# Patient Record
Sex: Male | Born: 1970 | Race: Black or African American | Hispanic: No | Marital: Married | Smoking: Never smoker
Health system: Southern US, Community
[De-identification: ages and names within clinical notes are randomized; demographics above are authoritative.]

## PROBLEM LIST (undated history)

## (undated) DIAGNOSIS — M549 Dorsalgia, unspecified: Secondary | ICD-10-CM

## (undated) HISTORY — DX: Dorsalgia, unspecified: M54.9

---

## 2009-02-07 ENCOUNTER — Encounter: Admission: RE | Admit: 2009-02-07 | Discharge: 2009-02-07 | Payer: Self-pay | Admitting: Occupational Medicine

## 2012-02-11 ENCOUNTER — Ambulatory Visit: Payer: Managed Care, Other (non HMO) | Admitting: Family Medicine

## 2012-02-11 VITALS — BP 150/87 | HR 76 | Temp 98.3°F | Resp 16 | Ht 72.0 in | Wt 206.0 lb

## 2012-02-11 DIAGNOSIS — M545 Low back pain, unspecified: Secondary | ICD-10-CM

## 2012-02-11 DIAGNOSIS — S335XXA Sprain of ligaments of lumbar spine, initial encounter: Secondary | ICD-10-CM

## 2012-02-11 MED ORDER — CYCLOBENZAPRINE HCL 10 MG PO TABS: 10.0000 mg | ORAL_TABLET | Freq: Every evening | ORAL | Status: AC | PRN

## 2012-02-11 MED ORDER — NAPROXEN 500 MG PO TABS: 500.0000 mg | ORAL_TABLET | Freq: Two times a day (BID) | ORAL | Status: AC

## 2012-02-11 MED ORDER — TRAMADOL HCL 50 MG PO TABS: 50.0000 mg | ORAL_TABLET | Freq: Three times a day (TID) | ORAL | Status: AC | PRN

## 2012-02-11 NOTE — Progress Notes (Signed)
  [facility]:  Office Visit  Chief Complaint:  Chief Complaint  Patient presents with  . Back Pain    LBP * 2 -3 days (has had this pain before- see chart) -using tylenol/ibuprofen    HPI: Victor [name] is a 41 y.o. male who complains of low back with bending, this is an acute on chronic problem that started 3 days ago. No problems with standing or sitting. No radicular pain so far. Intermittent tingling down right leg in prior episodes. Hx of traumatic back injury include  Falling on coconut when young and fell on back.  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 Denies loss of bowel and bladder function.  History reviewed. No pertinent past medical history. History reviewed. No pertinent past surgical history. History   Social History  . Marital Status: Married    Spouse Name: N/A    Number of Children: N/A  . Years of Education: N/A   Social History Main Topics  . Smoking status: Never Smoker   . Smokeless tobacco: None  . Alcohol Use: Yes  . Drug Use: No  . Sexually Active: None   Other Topics Concern  . None   Social History Narrative  . None   No family history on file. Allergies  Allergen Reactions  . Penicillins Itching and Rash   Prior to Admission medications   Not on File     ROS: The patient denies fevers, chills, night sweats, unintentional weight loss, chest pain, palpitations, wheezing, dyspnea on exertion, nausea, vomiting, abdominal pain, dysuria, hematuria, melena, numbness, weakness, or tingling. + back pain  All other systems have been reviewed and were otherwise negative with the exception of those mentioned in the HPI and as above.    PHYSICAL EXAM: Filed Vitals:   02/11/12 1028  BP: 150/87  Pulse: 76  Temp: 98.3 F (36.8 C)  Resp: 16   Filed Vitals:   02/11/12 1028  Height: 6' (1.829 m)  Weight: 206 lb (93.441 kg)    Body mass index is 27.94 kg/(m^2).  General: Alert, no acute distress HEENT:  Normocephalic, atraumatic, oropharynx patent.  Cardiovascular:  Regular rate and rhythm, no rubs murmurs or gallops.  No Carotid bruits, radial pulse intact. No pedal edema.  Respiratory: Clear to auscultation bilaterally.  No wheezes, rales, or rhonchi.  No cyanosis, no use of accessory musculature GI: No organomegaly, abdomen is soft and non-tender, positive bowel sounds.  No masses. Skin: No rashes. Neurologic: Facial musculature symmetric. Psychiatric: Patient is appropriate throughout our interaction. Lymphatic: No cervical lymphadenopathy Musculoskeletal: Gait intact. Neck: nl Thoracic: nl Lumbar:  Full AROM, PROM Pain with flexion but is able to touch his toes + paraspinal msk tenderness bilaterally at L2-L5 Sensation intact, 5/5 strength, +2DTRs biceps, knee, ankles bilaterally  LABS: No results found for this or any previous visit.   EKG/XRAY:   Primary read interpreted by Dr. Conley Rolls at [facility].   ASSESSMENT/PLAN: Encounter Diagnoses  Name Primary?  . Low back pain Yes  . Low back strain     D/w patient sxs management and strengthening exercises Rx Flexeril, Naproxen and Tramadol F/u in 1 month/prn   Riyah Bardon PHUONG, DO 02/11/2012 11:27 AM

## 2013-09-02 ENCOUNTER — Ambulatory Visit: Payer: Managed Care, Other (non HMO) | Admitting: Family Medicine

## 2013-09-02 ENCOUNTER — Other Ambulatory Visit: Payer: Self-pay

## 2013-09-02 VITALS — BP 130/76 | HR 87 | Temp 98.6°F | Resp 16 | Ht 72.0 in | Wt 206.0 lb

## 2013-09-02 DIAGNOSIS — M545 Low back pain, unspecified: Secondary | ICD-10-CM

## 2013-09-02 DIAGNOSIS — M47817 Spondylosis without myelopathy or radiculopathy, lumbosacral region: Secondary | ICD-10-CM

## 2013-09-02 DIAGNOSIS — M549 Dorsalgia, unspecified: Secondary | ICD-10-CM

## 2013-09-02 MED ORDER — PREDNISONE 20 MG PO TABS: ORAL_TABLET | ORAL | Status: DC

## 2013-09-02 MED ORDER — METHYLPREDNISOLONE ACETATE 80 MG/ML IJ SUSP
80.0000 mg | Freq: Once | INTRAMUSCULAR | Status: AC
  Administered 2013-09-02: 80 mg via INTRAMUSCULAR

## 2013-09-02 NOTE — Patient Instructions (Signed)
Sciatica Sciatica is pain, weakness, numbness, or tingling along the path of the sciatic nerve. The nerve starts in the lower back and runs down the back of each leg. The nerve controls the muscles in the lower leg and in the back of the knee, while also providing sensation to the back of the thigh, lower leg, and the sole of your foot. Sciatica is a symptom of another medical condition. For instance, nerve damage or certain conditions, such as a herniated disk or bone spur on the spine, pinch or put pressure on the sciatic nerve. This causes the pain, weakness, or other sensations normally associated with sciatica. Generally, sciatica only affects one side of the body. CAUSES   Herniated or slipped disc.  Degenerative disk disease.  A pain disorder involving the narrow muscle in the buttocks (piriformis syndrome).  Pelvic injury or fracture.  Pregnancy.  Tumor (rare). SYMPTOMS  Symptoms can vary from mild to very severe. The symptoms usually travel from the low back to the buttocks and down the back of the leg. Symptoms can include:  Mild tingling or dull aches in the lower back, leg, or hip.  Numbness in the back of the calf or sole of the foot.  Burning sensations in the lower back, leg, or hip.  Sharp pains in the lower back, leg, or hip.  Leg weakness.  Severe back pain inhibiting movement. These symptoms may get worse with coughing, sneezing, laughing, or prolonged sitting or standing. Also, being overweight may worsen symptoms. DIAGNOSIS  Your caregiver will perform a physical exam to look for common symptoms of sciatica. He or she may ask you to do certain movements or activities that would trigger sciatic nerve pain. Other tests may be performed to find the cause of the sciatica. These may include:  Blood tests.  X-rays.  Imaging tests, such as an MRI or CT scan. TREATMENT  Treatment is directed at the cause of the sciatic pain. Sometimes, treatment is not necessary  and the pain and discomfort goes away on its own. If treatment is needed, your caregiver may suggest:  Over-the-counter medicines to relieve pain.  Prescription medicines, such as anti-inflammatory medicine, muscle relaxants, or narcotics.  Applying heat or ice to the painful area.  Steroid injections to lessen pain, irritation, and inflammation around the nerve.  Reducing activity during periods of pain.  Exercising and stretching to strengthen your abdomen and improve flexibility of your spine. Your caregiver may suggest losing weight if the extra weight makes the back pain worse.  Physical therapy.  Surgery to eliminate what is pressing or pinching the nerve, such as a bone spur or part of a herniated disk. HOME CARE INSTRUCTIONS   Only take over-the-counter or prescription medicines for pain or discomfort as directed by your caregiver.  Apply ice to the affected area for 20 minutes, 3 4 times a day for the first 48 72 hours. Then try heat in the same way.  Exercise, stretch, or perform your usual activities if these do not aggravate your pain.  Attend physical therapy sessions as directed by your caregiver.  Keep all follow-up appointments as directed by your caregiver.  Do not wear high heels or shoes that do not provide proper support.  Check your mattress to see if it is too soft. A firm mattress may lessen your pain and discomfort. SEEK IMMEDIATE MEDICAL CARE IF:   You lose control of your bowel or bladder (incontinence).  You have increasing weakness in the lower back,   see if it is too soft. A firm mattress may lessen your pain and discomfort.  SEEK IMMEDIATE MEDICAL CARE IF:    You lose control of your bowel or bladder (incontinence).   You have increasing weakness in the lower back, pelvis, buttocks, or legs.   You have redness or swelling of your back.   You have a burning sensation when you urinate.   You have pain that gets worse when you lie down or awakens you at night.   Your pain is worse than you have experienced in the past.   Your pain is lasting longer than 4 weeks.   You are suddenly losing weight without reason.  MAKE SURE YOU:   Understand these  instructions.   Will watch your condition.   Will get help right away if you are not doing well or get worse.  Document Released: 10/23/2001 Document Revised: 04/29/2012 Document Reviewed: 03/09/2012  Exit[employer] Patient Information 2014 [employer]  Back Pain, Adult  Low back pain is very common. About 1 in 5 people have back pain.The cause of low back pain is rarely dangerous. The pain often gets better over time.About half of people with a sudden onset of back pain feel better in just 2 weeks. About 8 in 10 people feel better by 6 weeks.   CAUSES  Some common causes of back pain include:   Strain of the muscles or ligaments supporting the spine.   Wear and tear (degeneration) of the spinal discs.   Arthritis.   Direct injury to the back.  DIAGNOSIS  Most of the time, the direct cause of low back pain is not known.However, back pain can be treated effectively even when the exact cause of the pain is unknown.Answering your caregiver's questions about your overall health and symptoms is one of the most accurate ways to make sure the cause of your pain is not dangerous. If your caregiver needs more information, he or she may order lab work or imaging tests (X-rays or MRIs).However, even if imaging tests show changes in your back, this usually does not require surgery.  HOME CARE INSTRUCTIONS  For many people, back pain returns.Since low back pain is rarely dangerous, it is often a condition that people can learn to manageon their own.    Remain active. It is stressful on the back to sit or stand in one place. Do not sit, drive, or stand in one place for more than 30 minutes at a time. Take short walks on level surfaces as soon as pain allows.Try to increase the length of time you walk each day.   Do not stay in bed.Resting more than 1 or 2 days can delay your recovery.   Do not avoid exercise or work.Your body is made to move.It is not dangerous to be active, even though your back may hurt.Your  back will likely heal faster if you return to being active before your pain is gone.   Pay attention to your body when you bend and lift. Many people have less discomfortwhen lifting if they bend their knees, keep the load close to their bodies,and avoid twisting. Often, the most comfortable positions are those that put less stress on your recovering back.   Find a comfortable position to sleep. Use a firm mattress and lie on your side with your knees slightly bent. If you lie on your back, put a pillow under your knees.   Only take over-the-counter or prescription medicines as directed by your caregiver.   Over-the-counter medicines to reduce pain and inflammation are often the most helpful.Your caregiver may prescribe muscle relaxant drugs.These medicines help dull your pain so you can more quickly return to your normal activities and healthy exercise.   Put ice on the injured area.   Put ice in a plastic bag.   Place a towel between your skin and the bag.   Leave the ice on for 15-20 minutes, 3-4 times a day for the first 2 to 3 days. After that, ice and heat may be alternated to reduce pain and spasms.   Ask your caregiver about trying back exercises and gentle massage. This may be of some benefit.   Avoid feeling anxious or stressed.Stress increases muscle tension and can worsen back pain.It is important to recognize when you are anxious or stressed and learn ways to manage it.Exercise is a great option.  SEEK MEDICAL CARE IF:   You have pain that is not relieved with rest or medicine.   You have pain that does not improve in 1 week.   You have new symptoms.   You are generally not feeling well.  SEEK IMMEDIATE MEDICAL CARE IF:    You have pain that radiates from your back into your legs.   You develop new bowel or bladder control problems.   You have unusual weakness or numbness in your arms or legs.   You develop nausea or vomiting.   You develop abdominal pain.   You feel  faint.  Document Released: 10/29/2005 Document Revised: 04/29/2012 Document Reviewed: 03/19/2011  [employer] Patient Information 2014 [employer].

## 2013-09-02 NOTE — Progress Notes (Signed)
Patient ID: Victor [name], male   DOB: 04-01-1971, 42 y.o.   MRN: [id number]  @[email or web address] @  Patient ID: Victor [name] MRN: [id number], DOB: 1971/10/13, 42 y.o. Date of Encounter: 09/02/2013, 1:17 PM This chart was scribed for Elvina Sidle, MD by Valera Castle, ED Scribe. This patient was seen in room 12 and the patient's care was started at 1:17 PM.   Primary Physician: No primary provider on file.  Chief Complaint: Back Pain  HPI: 42 y.o. year old male with history below presents with gradual, moderate, constant back pain, onset 7 years ago after lifting an object. He states that the pain is deep rather than superficial. He reports that it hurts to bend over. He states that when he lifts his left leg he can feel pain, but denies any pain with a right leg lift. He reports good strength in his LE.   He denies any fever, bladder or bowel incontinence.   He states he is from [place] in [place]. 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  [facility] on [address]   History reviewed. No pertinent past medical history.   Home Meds: Prior to Admission medications   Not on File    Allergies:  Allergies  Allergen Reactions  . Penicillins Itching and Rash    History   Social History  . Marital Status: Married    Spouse Name: N/A    Number of Children: N/A  . Years of Education: N/A   Occupational History  . Not on file.   Social History Main Topics  . Smoking status: Never Smoker   . Smokeless tobacco: Not on file  . Alcohol Use: Yes  . Drug Use: No  . Sexual Activity: Not on file   Other Topics Concern  . Not on file   Social History Narrative  . No narrative on file     Review of Systems: Constitutional: negative for chills, fever, night sweats, weight changes, or fatigue  HEENT: negative for vision changes, hearing loss, congestion, rhinorrhea, ST, epistaxis, or sinus pressure Cardiovascular:  negative for chest pain or palpitations Respiratory: negative for hemoptysis, wheezing, shortness of breath, or cough Abdominal: negative for abdominal pain, nausea, vomiting, diarrhea, or constipation Dermatological: negative for rash Musc/Skeletal: negative for LE pain Positive for back pain Neurologic: negative for headache, dizziness, syncope, or LE numbness All other systems reviewed and are otherwise negative with the exception to those above and in the HPI.   Physical Exam: Blood pressure 130/76, pulse 87, temperature 98.6 F (37 C), temperature source Oral, resp. rate 16, height 6' (1.829 m), weight 206 lb (93.441 kg), SpO2 97.00%., Body mass index is 27.93 kg/(m^2). General: Well developed, well nourished, in no acute distress. Head: Normocephalic, atraumatic, eyes without discharge, sclera non-icteric, nares are without discharge. Bilateral auditory canals clear, TM's are without perforation, pearly grey and translucent with reflective cone of light bilaterally. Oral cavity moist, posterior pharynx without exudate, erythema, peritonsillar abscess, or post nasal drip.  Neck: Supple. No thyromegaly. Full ROM. No lymphadenopathy. Lungs: Clear bilaterally to auscultation without wheezes, rales, or rhonchi. Breathing is unlabored. Heart: RRR with S1 S2. No murmurs, rubs, or gallops appreciated. Abdomen: Soft, non-tender, non-distended with normoactive bowel sounds. No hepatomegaly. No rebound/guarding. No obvious abdominal masses. Msk:  Strength and tone normal for age. Extremities/Skin: Warm and dry. No clubbing or cyanosis. No edema. No rashes or suspicious lesions. Neuro: Alert and oriented  X 3. Moves all extremities spontaneously. Gait is normal. CNII-XII grossly in tact. Psych:  Responds to questions appropriately with a normal affect.  Reflexes normal Straight leg raising:  Slightly positive on left  ASSESSMENT AND PLAN:  42 y.o. year old male with chronic low back pain and acute  exacerbation.  No red flags Low back pain - Plan: predniSONE (DELTASONE) 20 MG tablet     Signed, Elvina Sidle, MD 09/02/2013 1:17 PM with chronic low back pain and acute  exacerbation.  No red flags Low back pain - Plan: predniSONE (DELTASONE) 20 MG tablet     Signed, Elvina Sidle, MD 09/02/2013 1:17 PM with history below presents with gradual, moderate, constant back pain, onset 7 years ago after lifting an object. He states that the pain is deep rather than superficial. He reports that it hurts to bend over. He states that when he lifts his left leg he can feel pain, but denies any pain with a right leg lift. He reports good strength in his LE.   He denies any fever, bladder or bowel incontinence.   He states he is from [place] in [place]. 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  [facility] on [address]   History reviewed. No pertinent past medical history.   Home Meds: Prior to Admission medications   Not on File    Allergies:  Allergies  Allergen Reactions  . Penicillins Itching and Rash    History   Social History  . Marital Status: Married    Spouse Name: N/A    Number of Children: N/A  . Years of Education: N/A   Occupational History  . Not on file.   Social History Main Topics  . Smoking status: Never Smoker   . Smokeless tobacco: Not on file  . Alcohol Use: Yes  . Drug Use: No  . Sexual Activity: Not on file   Other Topics Concern  . Not on file   Social History Narrative  . No narrative on file     Review of Systems: Constitutional: negative for chills, fever, night sweats, weight changes, or fatigue  HEENT: negative for vision changes, hearing loss, congestion, rhinorrhea, ST, epistaxis, or sinus pressure Cardiovascular:  negative for chest pain or palpitations Respiratory: negative for hemoptysis, wheezing, shortness of breath, or cough Abdominal: negative for abdominal pain, nausea, vomiting, diarrhea, or constipation Dermatological: negative for rash Musc/Skeletal: negative for LE pain Positive for back pain Neurologic: negative for headache, dizziness, syncope, or LE numbness All other systems reviewed and are otherwise negative with the exception to those above and in the HPI.   Physical Exam: Blood pressure 130/76, pulse 87, temperature 98.6 F (37 C), temperature source Oral, resp. rate 16, height 6' (1.829 m), weight 206 lb (93.441 kg), SpO2 97.00%., Body mass index is 27.93 kg/(m^2). General: Well developed, well nourished, in no acute distress. Head: Normocephalic, atraumatic, eyes without discharge, sclera non-icteric, nares are without discharge. Bilateral auditory canals clear, TM's are without perforation, pearly grey and translucent with reflective cone of light bilaterally. Oral cavity moist, posterior pharynx without exudate, erythema, peritonsillar abscess, or post nasal drip.  Neck: Supple. No thyromegaly. Full ROM. No lymphadenopathy. Lungs: Clear bilaterally to auscultation without wheezes, rales, or rhonchi. Breathing is unlabored. Heart: RRR with S1 S2. No murmurs, rubs, or gallops appreciated. Abdomen: Soft, non-tender, non-distended with normoactive bowel sounds. No hepatomegaly. No rebound/guarding. No obvious abdominal masses. Msk:  Strength and tone normal for age. Extremities/Skin: Warm and dry. No clubbing or cyanosis. No edema. No rashes or suspicious lesions. Neuro: Alert and oriented  X 3. Moves all extremities spontaneously. Gait is normal. CNII-XII grossly in tact. Psych:  Responds to questions appropriately with a normal affect.  Reflexes normal Straight leg raising:  Slightly positive on left  ASSESSMENT AND PLAN:  42 y.o. year old male with chronic low back pain and acute  exacerbation.  No red flags Low back pain - Plan: predniSONE (DELTASONE) 20 MG tablet     Signed, Elvina Sidle, MD 09/02/2013 1:17 PM with chronic low back pain and acute  exacerbation.  No red flags Low back pain - Plan: predniSONE (DELTASONE) 20 MG tablet     Signed, Elvina Sidle, MD 09/02/2013 1:17 PM

## 2013-09-02 NOTE — Addendum Note (Signed)
Addended by: Elvina Sidle on: 09/02/2013 01:27 PM   Modules accepted: Orders

## 2013-09-07 ENCOUNTER — Ambulatory Visit: Payer: Managed Care, Other (non HMO) | Admitting: Family Medicine

## 2013-09-07 VITALS — BP 120/80 | HR 76 | Temp 98.4°F | Resp 16 | Ht 72.5 in | Wt 261.0 lb

## 2013-09-07 DIAGNOSIS — M545 Low back pain, unspecified: Secondary | ICD-10-CM

## 2013-09-07 MED ORDER — CYCLOBENZAPRINE HCL 10 MG PO TABS: ORAL_TABLET | ORAL | Status: DC

## 2013-09-07 MED ORDER — OXAPROZIN 600 MG PO TABS: 1200.0000 mg | ORAL_TABLET | Freq: Every day | ORAL | Status: DC

## 2013-09-07 NOTE — Patient Instructions (Signed)
Work hard on trying to lose weight. You need to eat less and exercise more.  Consider trying to find a seat cushion for your car that can't change the angle you are sitting at which might help.  Take the cyclobenzaprine, muscle relaxants, one at bedtime  Take the Daypro one twice daily at breakfast and supper for pain and inflammation  Take 8 hour Tylenol 2 pills at bedtime  Return if worse or not improving

## 2013-09-07 NOTE — Progress Notes (Signed)
Subjective: Patient continues to have problems with low back pain. He was treated here a week ago with prednisone and a Depo-Medrol injection. The pain is in the lower lumbar region and does not radiate. He's been having pains off and on for years. He has previously had x-rays which showed some mild degenerative changes and a LS CT scan which apparently was okay. I have not viewed either of these. Problems continued to persist. 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  Objective: Overweight male in no major acute distress. Mild tenderness of the lower lumbar spine, especially when flexing anteriorly. Mild pain when extending the spine. Side to side flexion does not seem to affect him much. Straight leg raise is negative. Deep tender reflexes symmetrical.  Assessment: Lumbago  Plan: Daypro one twice daily 600 mg Flexeril 10 mg at bedtime Work hard on weight loss and exercise Consider trying to get a seat in syrup that might make him sit at a different angle or position and could help. Return if worse, I would consider PT.

## 2013-12-10 ENCOUNTER — Ambulatory Visit: Payer: Managed Care, Other (non HMO) | Admitting: Physician Assistant

## 2013-12-10 VITALS — BP 120/80 | HR 88 | Temp 98.5°F | Resp 16 | Ht 72.0 in | Wt 260.0 lb

## 2013-12-10 DIAGNOSIS — M545 Low back pain, unspecified: Secondary | ICD-10-CM

## 2013-12-10 DIAGNOSIS — M47817 Spondylosis without myelopathy or radiculopathy, lumbosacral region: Secondary | ICD-10-CM

## 2013-12-10 DIAGNOSIS — M47816 Spondylosis without myelopathy or radiculopathy, lumbar region: Secondary | ICD-10-CM

## 2013-12-10 MED ORDER — MELOXICAM 15 MG PO TABS: 15.0000 mg | ORAL_TABLET | Freq: Every day | ORAL | Status: DC

## 2013-12-10 NOTE — Patient Instructions (Signed)
Do not use any ibuprofen, naprosyn and daypro while taking Meloxicam. You may take acetaminophen (tylenol) while taking Meloxicam.

## 2013-12-10 NOTE — Progress Notes (Signed)
   Subjective:    Patient ID: Victor [name], male    DOB: 02/19/1971, 43 y.o.   MRN: [id number]  HPI  Patient presents with chronic lower back pain.  Denies pain radiating into legs. Worsens when he bends over and reports difficulty standing. 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   Took tylenol last night with a little relief.  Denies loss of bowel and bladder function.   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    Review of Systems As above.     Objective:   Physical Exam  Vitals reviewed. Constitutional: He is oriented to person, place, and time. He appears well-developed and well-nourished.  HENT:  Head: Normocephalic and atraumatic.  Eyes: Conjunctivae are normal.  Neck: Normal range of motion. Neck supple.  Cardiovascular: Normal rate, regular rhythm and normal heart sounds.   Pulmonary/Chest: Effort normal and breath sounds normal.  Musculoskeletal:       Cervical back: He exhibits normal range of motion, no tenderness, no bony tenderness and no pain.       Thoracic back: He exhibits normal range of motion, no tenderness, no bony tenderness and no pain.       Lumbar back: He exhibits pain. He exhibits normal range of motion, no tenderness and no bony tenderness.  Neurological: He is alert and oriented to person, place, and time.  Skin: Skin is warm and dry.  Psychiatric: He has a normal mood and affect. His behavior is normal. Judgment and thought content normal.        Assessment & Plan:   1. Low back pain 2. Degenerative joint disease (DJD) of lumbar spine Take Meloxicam daily for back pain. Do not take ibuprofen, naprosyn and daypro while taking Meloxicam. Referral to PT.  - meloxicam (MOBIC) 15 MG tablet; Take 1 tablet (15 mg total) by mouth daily.  Dispense: 30 tablet; Refill: 1 - Ambulatory referral to Physical Therapy

## 2013-12-11 ENCOUNTER — Encounter: Payer: Self-pay | Admitting: Physician Assistant

## 2013-12-11 DIAGNOSIS — M545 Low back pain, unspecified: Secondary | ICD-10-CM | POA: Insufficient documentation

## 2013-12-11 NOTE — Progress Notes (Signed)
I have examined this patient along with the student and agree.  

## 2014-01-18 ENCOUNTER — Ambulatory Visit: Payer: Managed Care, Other (non HMO) | Admitting: Physical Therapy

## 2014-02-04 ENCOUNTER — Ambulatory Visit: Payer: Managed Care, Other (non HMO) | Admitting: Emergency Medicine

## 2014-02-04 VITALS — BP 130/100 | HR 84 | Temp 99.0°F | Resp 16 | Ht 72.5 in | Wt 265.4 lb

## 2014-02-04 DIAGNOSIS — J309 Allergic rhinitis, unspecified: Secondary | ICD-10-CM

## 2014-02-04 MED ORDER — TRIAMCINOLONE ACETONIDE 55 MCG/ACT NA AERO: 2.0000 | INHALATION_SPRAY | Freq: Every day | NASAL | Status: DC

## 2014-02-04 MED ORDER — FEXOFENADINE HCL 180 MG PO TABS: 180.0000 mg | ORAL_TABLET | Freq: Every day | ORAL | Status: DC

## 2014-02-04 NOTE — Progress Notes (Signed)
[facility] [address], [place] [id number] [phone]  Date:  02/04/2014   [facility]  [name]   DOB:  04/14/1971   MRN:  [id number]  PCP:  No primary provider on file.    Chief Complaint: Cough and Nasal Congestion   History of Present Illness:  Victor [name] is a 43 y.o. very pleasant male patient who presents with the following:  Ill three days with nasal congestion, watery nasal drainage and frequent sneezing.  No cough, wheezing or shortness of breath.  No nausea or vomiting.  No stool change.  No fever or chills or sore throat.  No itching watery eyes.  Denies other complaint or health concern today.   Patient Active Problem List   Diagnosis Date Noted  . Low back pain 12/11/2013    No past medical history on file.  No past surgical history on file.  History  Substance Use Topics  . Smoking status: Never Smoker   . Smokeless tobacco: Not on file  . Alcohol Use: Yes    No family history on file.  No Known Allergies  Medication list has been reviewed and updated.  No current outpatient prescriptions on file prior to visit.   No current facility-administered medications on file prior to visit.    Review of Systems:  As per HPI, otherwise negative.    Physical Examination: Filed Vitals:   02/04/14 1725  BP: 130/100  Pulse: 84  Temp: 99 F (37.2 C)  Resp: 16   Filed Vitals:   02/04/14 1725  Height: 6' 0.5" (1.842 m)  Weight: 265 lb 6.4 oz (120.385 kg)   Body mass index is 35.48 kg/(m^2). Ideal Body Weight: Weight in (lb) to have BMI = 25: 186.5  GEN: WDWN, NAD, Non-toxic, A & O x 3 HEENT: Atraumatic, Normocephalic. Neck supple. No masses, No LAD. Ears and Nose: No external deformity. CV: RRR, No M/G/R. No JVD. No thrill. No extra heart sounds. PULM: CTA B, no wheezes, crackles, rhonchi. No retractions. No resp. distress. No accessory muscle use. ABD: S, NT, ND, +BS. No rebound. No HSM. EXTR: No  c/c/e NEURO Normal gait.  PSYCH: Normally interactive. Conversant. Not depressed or anxious appearing.  Calm demeanor.    Assessment and Plan: Seasonal allergic rhinitis Allegra nasacort   Signed,  Victor OdorJeffery Jala Dundon, MD

## 2014-02-04 NOTE — Patient Instructions (Signed)

## 2014-02-10 ENCOUNTER — Ambulatory Visit: Payer: Self-pay | Admitting: Family Medicine

## 2014-02-10 VITALS — BP 138/90 | HR 90 | Temp 98.7°F | Resp 18 | Ht 71.0 in | Wt 252.0 lb

## 2014-02-10 DIAGNOSIS — Z0289 Encounter for other administrative examinations: Secondary | ICD-10-CM

## 2014-02-10 DIAGNOSIS — Z024 Encounter for examination for driving license: Secondary | ICD-10-CM

## 2014-02-10 NOTE — Progress Notes (Signed)
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  Past medical history: Medical illnesses: No major illnesses. Has had some back pain problems. Surgeries: None Allergies: None Regular medications: No spray  Family history: Both parents are live and in [place].  Social history: [place] is from [place] and is now a [place] citizen. Married with 3 children. He is a CuratorChristian.  Review of systems: Constitutional: Unremarkable HEENT: Unremarkable Respiratory: Unremarkable Cardiovascular: Unremarkable Gastrointestinal: Unremarkable Genitourinary: Unremarkable Dermatologic: Unremarkable Neurologic: Unremarkable Musculoskeletal: Has had problems with his back intermittently  Physical exam: Overweight man in no major acute distress. His TMs are normal. Eyes PERRLA. Fundi benign. EOMs intact. Throat clear. Neck supple without nodes or thyromegaly. No carotid bruits. Chest is clear to auscultation. Heart regular without murmurs. Abdomen soft without mass or tenderness. Normal male external genitalia with no hernias. Pulses are good in his feet. Extremities unremarkable. Straight leg raise test negative. Skin normal.  Assessment: Normal DOT physical examination except for being overweight  Plan: Told him he was healthy but needs to watch his weight.

## 2014-09-28 ENCOUNTER — Ambulatory Visit (INDEPENDENT_AMBULATORY_CARE_PROVIDER_SITE_OTHER): Payer: Managed Care, Other (non HMO)

## 2014-09-28 ENCOUNTER — Ambulatory Visit (INDEPENDENT_AMBULATORY_CARE_PROVIDER_SITE_OTHER): Payer: Managed Care, Other (non HMO) | Admitting: Family Medicine

## 2014-09-28 VITALS — BP 126/80 | HR 81 | Temp 98.8°F | Resp 17 | Ht 71.0 in | Wt 254.0 lb

## 2014-09-28 DIAGNOSIS — T148 Other injury of unspecified body region: Secondary | ICD-10-CM

## 2014-09-28 DIAGNOSIS — T148XXA Other injury of unspecified body region, initial encounter: Secondary | ICD-10-CM

## 2014-09-28 DIAGNOSIS — M545 Low back pain: Secondary | ICD-10-CM

## 2014-09-28 MED ORDER — MELOXICAM 15 MG PO TABS: 15.0000 mg | ORAL_TABLET | Freq: Every day | ORAL | Status: DC

## 2014-09-28 NOTE — Patient Instructions (Signed)
Back Exercises  Back exercises help treat and prevent back injuries. The goal of back exercises is to increase the strength of your abdominal and back muscles and the flexibility of your back. These exercises should be started when you no longer have back pain. Back exercises include:  · Pelvic Tilt. Lie on your back with your knees bent. Tilt your pelvis until the lower part of your back is against the floor. Hold this position 5 to 10 sec and repeat 5 to 10 times.  · Knee to Chest. Pull first 1 knee up against your chest and hold for 20 to 30 seconds, repeat this with the other knee, and then both knees. This may be done with the other leg straight or bent, whichever feels better.  · Sit-Ups or Curl-Ups. Bend your knees 90 degrees. Start with tilting your pelvis, and do a partial, slow sit-up, lifting your trunk only 30 to 45 degrees off the floor. Take at least 2 to 3 seconds for each sit-up. Do not do sit-ups with your knees out straight. If partial sit-ups are difficult, simply do the above but with only tightening your abdominal muscles and holding it as directed.  · Hip-Lift. Lie on your back with your knees flexed 90 degrees. Push down with your feet and shoulders as you raise your hips a couple inches off the floor; hold for 10 seconds, repeat 5 to 10 times.  · Back arches. Lie on your stomach, propping yourself up on bent elbows. Slowly press on your hands, causing an arch in your low back. Repeat 3 to 5 times. Any initial stiffness and discomfort should lessen with repetition over time.  · Shoulder-Lifts. Lie face down with arms beside your body. Keep hips and torso pressed to floor as you slowly lift your head and shoulders off the floor.  Do not overdo your exercises, especially in the beginning. Exercises may cause you some mild back discomfort which lasts for a few minutes; however, if the pain is more severe, or lasts for more than 15 minutes, do not continue exercises until you see your caregiver.  Improvement with exercise therapy for back problems is slow.   See your caregivers for assistance with developing a proper back exercise program.  Document Released: 12/06/2004 Document Revised: 01/21/2012 Document Reviewed: 08/30/2011  ExitCare® Patient Information ©2015 [employer]. This information is not intended to replace advice given to you by your health care provider. Make sure you discuss any questions you have with your health care provider.

## 2014-09-28 NOTE — Progress Notes (Signed)
Chief Complaint:  Chief Complaint  Patient presents with  . Back Pain    HPI: Victor [name] is a 43 y.o. male who is here for  Low back pain, intermittent, he feels it has been the same. The last time he came in he was given mobic and that seem to help . He was taking it in January. The last time he took the medicine was 1 year ago. 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 He states he started having 2006. He tried to lift something that was too heavy when he was workign at [employer]. We have not had xrays in some time. He has had low back with bending, this is an acute on chronic problem. 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 Hx of traumatic back injury include Falling on coconut when young and fell on back. 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 He was put on tramadol, diclofenac and also prednisone int he past but the mobic was the best.  Denies loss of bowel and bladder function.  Past Medical History  Diagnosis Date  . Back pain    History reviewed. No pertinent past surgical history. History   Social History  . Marital Status: Married    Spouse Name: N/A    Number of Children: N/A  . Years of Education: N/A   Occupational History  . taxi driver    Social History Main Topics  . Smoking status: Never Smoker   . Smokeless tobacco: None  . Alcohol Use: Yes  . Drug Use: No  . Sexual Activity: None   Other Topics Concern  . None   Social History Narrative   History reviewed. No pertinent family history. No Known Allergies Prior to Admission medications   Not on File     ROS: The patient denies fevers, chills, night sweats, unintentional weight loss, chest pain, palpitations, wheezing, dyspnea on exertion, nausea, vomiting, abdominal pain, dysuria, hematuria, melena, numbness, weakness, or tingling.   All  other systems have been reviewed and were otherwise negative with the exception of those mentioned in the HPI and as above.    PHYSICAL EXAM: Filed Vitals:   09/28/14 1711  BP: 126/80  Pulse: 81  Temp: 98.8 F (37.1 C)  Resp: 17   Filed Vitals:   09/28/14 1711  Height: 5\' 11"  (1.803 m)  Weight: 254 lb (115.214 kg)   Body mass index is 35.44 kg/(m^2).  General: Alert, no acute distress HEENT:  Normocephalic, atraumatic, oropharynx patent. EOMI, PERRLA Cardiovascular:  Regular rate and rhythm, no rubs murmurs or gallops.  No Carotid bruits, radial pulse intact. No pedal edema.  Respiratory: Clear to auscultation bilaterally.  No wheezes, rales, or rhonchi.  No cyanosis, no use of accessory musculature GI: No organomegaly, abdomen is soft and non-tender, positive bowel sounds.  No masses. Skin: No rashes. Neurologic: Facial musculature symmetric. Psychiatric: Patient is appropriate throughout our interaction. Lymphatic: No cervical lymphadenopathy Musculoskeletal: Gait intact. + paramsk tenderness  Low back midline, minimal scoliosis on exam Full ROM 5/5 strength, 2/2 DTRs No saddle anesthesia Straight leg negative Hip and knee exam--normal    LABS: No results found for this or any previous visit.   EKG/XRAY:   Primary [facility]. Neg for fx or discloation There is some  spurring or calcification from prior injury but nothing acute Loss of nl lumbar spine curvature   ASSESSMENT/PLAN: Encounter Diagnoses  Name Primary?  . Low back pain without sciatica, unspecified back pain laterality Yes  . Sprain and strain    RX mobic 15 mg daily with precautions F/u prn    Gross sideeffects, risk and benefits, and alternatives of medications d/w patient. Patient is aware that all medications have potential sideeffects and we are unable to predict every sideeffect or drug-drug interaction that may occur.  Yaniel Limbaugh PHUONG, DO 09/30/2014 7:35  AM

## 2014-09-30 ENCOUNTER — Encounter: Payer: Self-pay | Admitting: Family Medicine

## 2015-07-30 ENCOUNTER — Ambulatory Visit (INDEPENDENT_AMBULATORY_CARE_PROVIDER_SITE_OTHER): Payer: Managed Care, Other (non HMO)

## 2015-07-30 ENCOUNTER — Ambulatory Visit (INDEPENDENT_AMBULATORY_CARE_PROVIDER_SITE_OTHER): Payer: Managed Care, Other (non HMO) | Admitting: Physician Assistant

## 2015-07-30 VITALS — BP 132/80 | HR 92 | Temp 98.7°F | Resp 17 | Ht 71.5 in | Wt 258.0 lb

## 2015-07-30 DIAGNOSIS — K922 Gastrointestinal hemorrhage, unspecified: Secondary | ICD-10-CM | POA: Diagnosis not present

## 2015-07-30 DIAGNOSIS — M545 Low back pain, unspecified: Secondary | ICD-10-CM

## 2015-07-30 LAB — POCT CBC
Granulocyte percent: 53.4 %G (ref 37–80)
HCT, POC: 45.4 % (ref 43.5–53.7)
Hemoglobin: 14.1 g/dL (ref 14.1–18.1)
LYMPH, POC: 2.7 (ref 0.6–3.4)
MCH: 22.7 pg — AB (ref 27–31.2)
MCHC: 31.1 g/dL — AB (ref 31.8–35.4)
MCV: 72.9 fL — AB (ref 80–97)
MID (CBC): 0.4 (ref 0–0.9)
MPV: 9 fL (ref 0–99.8)
PLATELET COUNT, POC: 217 10*3/uL (ref 142–424)
POC Granulocyte: 3.5 (ref 2–6.9)
POC LYMPH %: 40.2 % (ref 10–50)
POC MID %: 6.4 %M (ref 0–12)
RBC: 6.23 M/uL — AB (ref 4.69–6.13)
RDW, POC: 17.3 %
WBC: 6.6 10*3/uL (ref 4.6–10.2)

## 2015-07-30 MED ORDER — CYCLOBENZAPRINE HCL 10 MG PO TABS: 10.0000 mg | ORAL_TABLET | Freq: Three times a day (TID) | ORAL | Status: DC | PRN

## 2015-07-30 MED ORDER — MELOXICAM 15 MG PO TABS: 15.0000 mg | ORAL_TABLET | Freq: Every day | ORAL | Status: DC

## 2015-07-30 NOTE — Patient Instructions (Signed)

## 2015-07-30 NOTE — Progress Notes (Addendum)
Subjective:    Patient ID: Victor [name], male    DOB: 08-23-1971, 44 y.o.   MRN: [id number]  HPI Patient present for lower back pain that started today when he bent down to pick up a piece of trash. Pain does not radiate and is achy in quality. Denies loss of ROM/sensation/fxn, weakness, numbness, or tingling of back or lower extremities. Denies incontinence. Bending down now does not cause additionally pain, but still feels pain in back. States that has taken meloxicam in the past and has worked well. Has seen ortho in the past and does not think that they did anything. Not currently taking anything for pain.  States that he has had bright red blood in stool for past 3 bowel movements, but not sure when first noticed. States that he has not had any rectal or abdominal pain. Denies fever, diarrhea, constipation, nausea, vomiting, and flatulence. Denies h/o colon polyps or hemorrhoids. Denies family h/o colon cancer. States that this has happened before several years prior and colonoscopy was clear. Took some powder and a pill and it got better.   NKDA.  Review of Systems  Constitutional: Negative for fever, chills, diaphoresis, appetite change and fatigue.  Respiratory: Negative for shortness of breath.   Gastrointestinal: Positive for blood in stool. Negative for nausea, vomiting, abdominal pain, diarrhea, constipation, abdominal distention and rectal pain.  Genitourinary: Negative.   Musculoskeletal: Positive for back pain. Negative for myalgias, joint swelling, arthralgias, gait problem, neck pain and neck stiffness.  Neurological: Negative for dizziness, weakness, light-headedness, numbness and headaches.       Objective:   Physical Exam  Constitutional: He is oriented to person, place, and time. He appears well-developed and well-nourished. No distress.  Blood pressure 132/80, pulse 92, temperature 98.7 F (37.1 C), temperature source Oral, resp. rate 17, height 5' 11.5"  (1.816 m), weight 258 lb (117.028 kg), SpO2 97 %.  HENT:  Head: Normocephalic and atraumatic.  Right Ear: External ear normal.  Left Ear: External ear normal.  Eyes: Conjunctivae are normal. Right eye exhibits no discharge. Left eye exhibits no discharge. No scleral icterus.  Neck: Normal range of motion. Neck supple.  Cardiovascular: Normal rate, regular rhythm, normal heart sounds and intact distal pulses.  Exam reveals no gallop and no friction rub.   No murmur heard. Pulmonary/Chest: Effort normal and breath sounds normal. No respiratory distress. He has no wheezes. He has no rales.  Abdominal: Soft. Bowel sounds are normal. He exhibits no distension. There is no tenderness. There is no rebound and no guarding.  Musculoskeletal: Normal range of motion. He exhibits no edema or tenderness.       Cervical back: Normal.       Thoracic back: Normal.       Lumbar back: He exhibits pain. He exhibits normal range of motion, no tenderness, no bony tenderness, no swelling, no edema, no deformity and no laceration.  Lymphadenopathy:    He has no cervical adenopathy.  Neurological: He is alert and oriented to person, place, and time. He has normal strength and normal reflexes. He displays no atrophy. No cranial nerve deficit or sensory deficit. He exhibits normal muscle tone. Coordination normal.  Skin: Skin is warm and dry. No rash noted. He is not diaphoretic. No erythema. No pallor.  Psychiatric: He has a normal mood and affect. His behavior is normal. Judgment and thought content normal.   Results for orders placed or performed in visit on 07/30/15  POCT CBC  Result  Value Ref Range   WBC 6.6 4.6 - 10.2 K/uL   Lymph, poc 2.7 0.6 - 3.4   POC LYMPH PERCENT 40.2 10 - 50 %L   MID (cbc) 0.4 0 - 0.9   POC MID % 6.4 0 - 12 %M   POC Granulocyte 3.5 2 - 6.9   Granulocyte percent 53.4 37 - 80 %G   RBC 6.23 (A) 4.69 - 6.13 M/uL   Hemoglobin 14.1 14.1 - 18.1 g/dL   HCT, POC 75.6 43.3 - 53.7 %   MCV  72.9 (A) 80 - 97 fL   MCH, POC 22.7 (A) 27 - 31.2 pg   MCHC 31.1 (A) 31.8 - 35.4 g/dL   RDW, POC 29.5 %   Platelet Count, POC 217 142 - 424 K/uL   MPV 9.0 0 - 99.8 fL   [facility] reading (PRIMARY) by  Dr. Dareen Piano. Mild degenerative changes.      Assessment & Plan:  1. Midline low back pain without sciatica - DG Lumbar Spine 2-3 Views; Future - meloxicam (MOBIC) 15 MG tablet; Take 1 tablet (15 mg total) by mouth daily.  Dispense: 30 tablet; Refill: 3 - cyclobenzaprine (FLEXERIL) 10 MG tablet; Take 1 tablet (10 mg total) by mouth 3 (three) times daily as needed for muscle spasms.  Dispense: 30 tablet; Refill: 0  2. Gastrointestinal hemorrhage, unspecified gastritis, unspecified gastrointestinal hemorrhage type - POCT CBC - Ambulatory referral to Gastroenterology   Janan Ridge PA-C  [facility] 07/30/2015 4:20 PM

## 2015-08-11 ENCOUNTER — Other Ambulatory Visit: Payer: Self-pay | Admitting: Physician Assistant

## 2015-08-12 NOTE — Telephone Encounter (Signed)
So you want to RF?

## 2015-08-31 NOTE — Progress Notes (Signed)
  Medical screening examination/treatment/procedure(s) were performed by non-physician practitioner and as supervising physician I was immediately available for consultation/collaboration.     

## 2016-01-19 ENCOUNTER — Ambulatory Visit (INDEPENDENT_AMBULATORY_CARE_PROVIDER_SITE_OTHER): Payer: BLUE CROSS/BLUE SHIELD | Admitting: Family Medicine

## 2016-01-19 VITALS — BP 120/80 | HR 87 | Temp 98.5°F | Resp 18 | Wt 260.2 lb

## 2016-01-19 DIAGNOSIS — M545 Low back pain, unspecified: Secondary | ICD-10-CM

## 2016-01-19 MED ORDER — MELOXICAM 15 MG PO TABS: 15.0000 mg | ORAL_TABLET | Freq: Every day | ORAL | Status: DC | PRN

## 2016-01-19 MED ORDER — CYCLOBENZAPRINE HCL 10 MG PO TABS: 10.0000 mg | ORAL_TABLET | Freq: Two times a day (BID) | ORAL | Status: DC | PRN

## 2016-01-19 NOTE — Progress Notes (Signed)
   Subjective:    Patient ID: Victor [name], male    DOB: 06/25/1971, 45 y.o.   MRN: [id number]  HPI This is a 45 year old man that presents with chronic lower back pain that has been increasing x 3 days. He wants a steroid shot. States that when he exercise he feels the pain worse. Patient states that he finished his medication about 3 months ago. Patient denies bowel changes, chest pain, n/v, or constipation. Patient would be willing to try PT.    Past Medical History  Diagnosis Date  . Back pain    History reviewed. No pertinent family history. Social History   Social History  . Marital Status: Married    Spouse Name: N/A  . Number of Children: N/A  . Years of Education: N/A   Occupational History  . taxi driver    Social History Main Topics  . Smoking status: Never Smoker   . Smokeless tobacco: Not on file  . Alcohol Use: Yes  . Drug Use: No  . Sexual Activity: Not on file   Other Topics Concern  . Not on file   Social History Narrative    Review of Systems  Constitutional: Positive for activity change (bending is harder). Negative for fever and chills.  HENT: Negative for congestion, mouth sores, rhinorrhea and sneezing.   Respiratory: Negative for cough, chest tightness and shortness of breath.   Cardiovascular: Negative for chest pain.  Gastrointestinal: Negative for nausea, vomiting, abdominal pain, diarrhea and constipation.  Musculoskeletal: Positive for back pain (lower back x3 days).  Neurological: Negative for dizziness.       Objective:   Physical Exam  Constitutional: He is oriented to person, place, and time. He appears well-developed and well-nourished.  HENT:  Head: Normocephalic.  Neck: Normal range of motion.  Cardiovascular: Normal rate, regular rhythm and normal heart sounds.   Pulmonary/Chest: Effort normal and breath sounds normal. No respiratory distress. He has no wheezes. He exhibits no tenderness.  Musculoskeletal: Normal range  of motion.  Painful to bend forward  Neurological: He is alert and oriented to person, place, and time.  Skin: Skin is warm and dry.  Psychiatric: He has a normal mood and affect. His behavior is normal. Judgment and thought content normal.     BP 120/80 mmHg  Pulse 87  Temp(Src) 98.5 F (36.9 C) (Oral)  Resp 18  Wt 260 lb 3.2 oz (118.026 kg)  SpO2 98%      Assessment & Plan:

## 2016-01-19 NOTE — Progress Notes (Signed)
   Subjective:    Patient ID: Victor [name], male    DOB: 04/24/1971, 45 y.o.   MRN: [id number]  HPI This is a pleasant 45 yo male who presents today with recurrent low back pain. He was seen here 6 times since 4/13 with back pain/lumbago diagnosis. The patient did well following last visit 07/30/15. He was prescribed meloxicam and cyclobenzaprine. He had been pain free until yesterday when he bent over while washing his car and pain returned. He has not taken anything for this episode of pain. He has never had numbness, tingling, weakness, radiation of pain. No bowel/bladder changes. He is requesting a "shot" today for his back.   Past Medical History  Diagnosis Date  . Back pain    History reviewed. No pertinent past surgical history. History reviewed. No pertinent family history. Social History  Substance Use Topics  . Smoking status: Never Smoker   . Smokeless tobacco: None  . Alcohol Use: Yes      Review of Systems  Constitutional: Negative for fever and chills.  Respiratory: Negative for cough and shortness of breath.   Cardiovascular: Negative for chest pain and leg swelling.  Musculoskeletal: Positive for back pain. Negative for gait problem and neck pain.  Neurological: Negative for weakness and numbness.       Objective:   Physical Exam  Constitutional: He is oriented to person, place, and time. He appears well-developed and well-nourished. No distress.  HENT:  Head: Normocephalic and atraumatic.  Eyes: Conjunctivae are normal.  Neck: Normal range of motion. Neck supple.  Cardiovascular: Normal rate, regular rhythm and normal heart sounds.   Pulmonary/Chest: Effort normal.  Musculoskeletal: Normal range of motion.       Cervical back: Normal.       Thoracic back: Normal.       Lumbar back: Normal.  Neurological: He is alert and oriented to person, place, and time. He has normal reflexes.  Skin: Skin is warm and dry. He is not diaphoretic.  Psychiatric: He  has a normal mood and affect. His behavior is normal. Judgment and thought content normal.  Vitals reviewed.     BP 120/80 mmHg  Pulse 87  Temp(Src) 98.5 F (36.9 C) (Oral)  Resp 18  Wt 260 lb 3.2 oz (118.026 kg)  SpO2 98%     Assessment & Plan:  1. Midline low back pain without sciatica - discussed chronic nature of patient's pain and encouraged weight loss. He is open to physical therapy and this has been ordered.  - meloxicam (MOBIC) 15 MG tablet; Take 1 tablet (15 mg total) by mouth daily as needed for pain.  Dispense: 30 tablet; Refill: 1 - cyclobenzaprine (FLEXERIL) 10 MG tablet; Take 1 tablet (10 mg total) by mouth 2 (two) times daily as needed for muscle spasms.  Dispense: 30 tablet; Refill: 0 - Ambulatory referral to Physical Therapy - Reviewed RTC precautions, follow up as needed  Victor Reeeborah Gessner, FNP-BC  [facility]  01/23/2016 8:22 AM

## 2016-01-19 NOTE — Patient Instructions (Signed)

## 2016-05-18 IMAGING — CR DG LUMBAR SPINE 2-3V
3 series · 3 of 3 positions shown · non-contrast
Comparison: 09/28/2014 lumbar spine radiographs.

CLINICAL DATA: Midline low back pain.

EXAM:
LUMBAR SPINE - 2-3 VIEW

[AP]
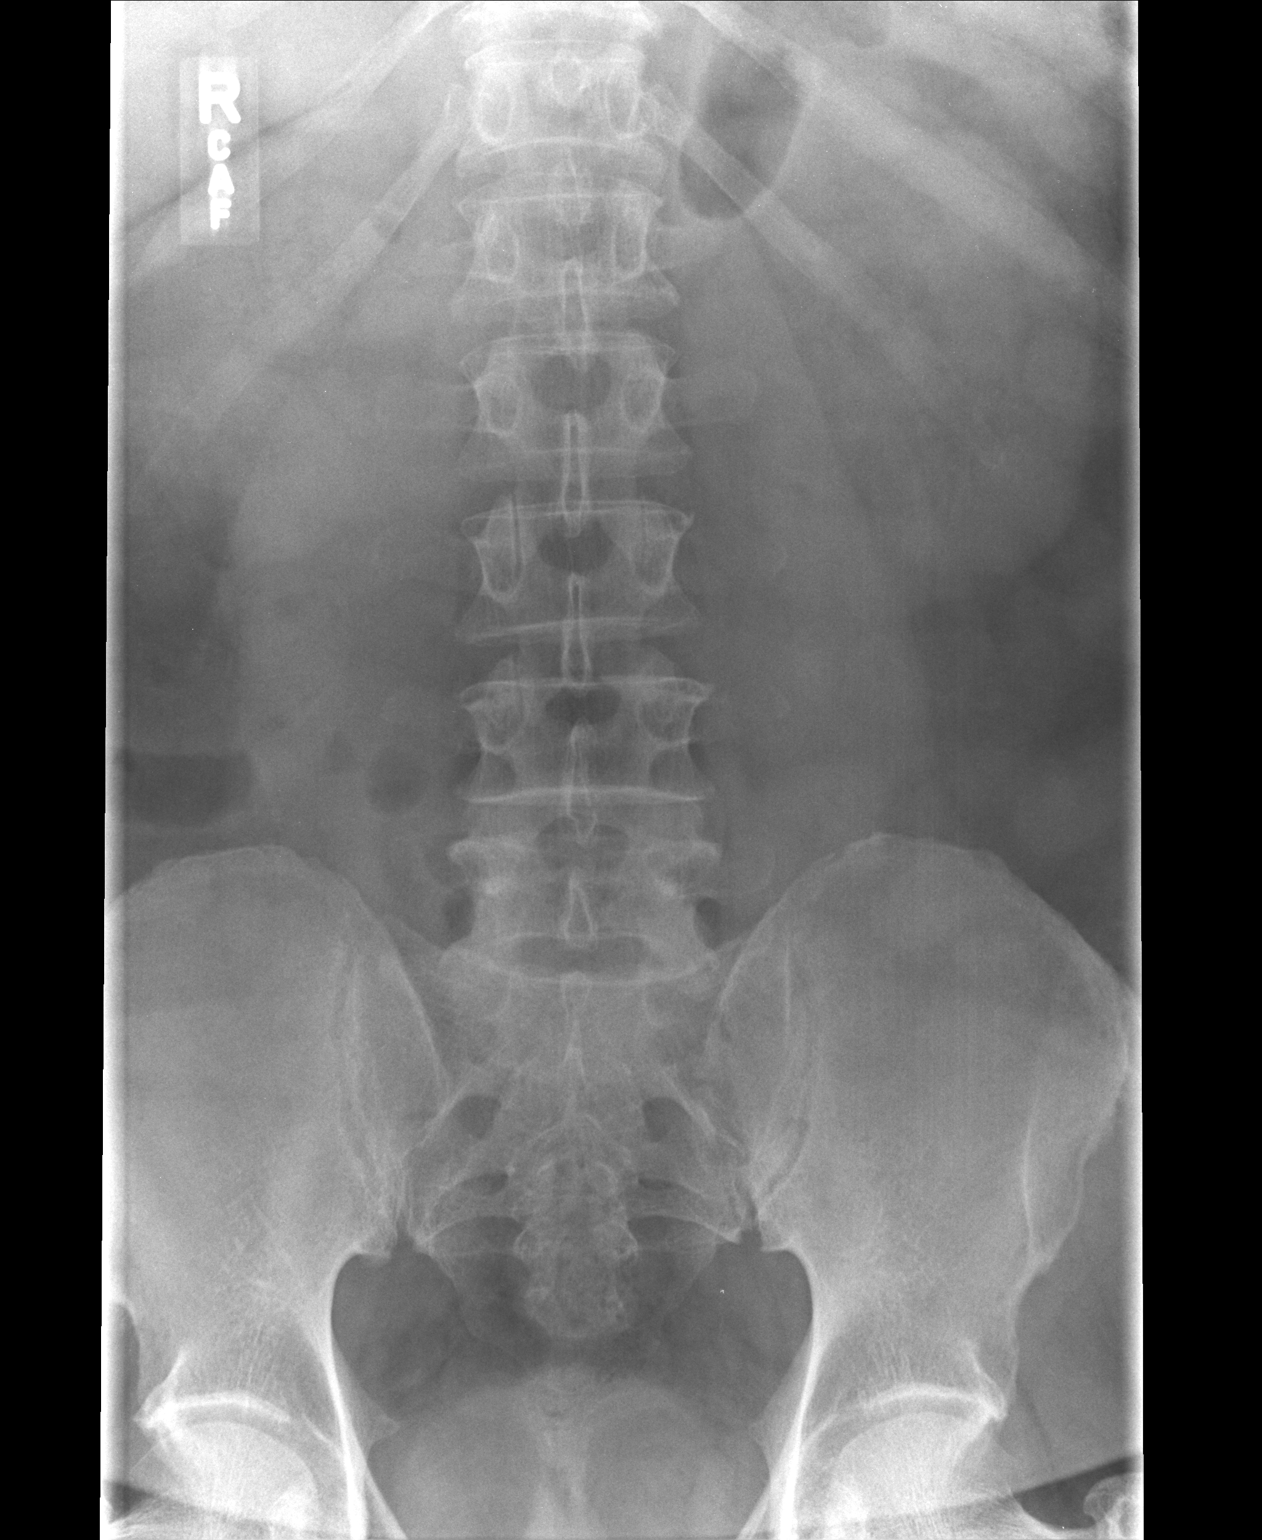

[lateral]
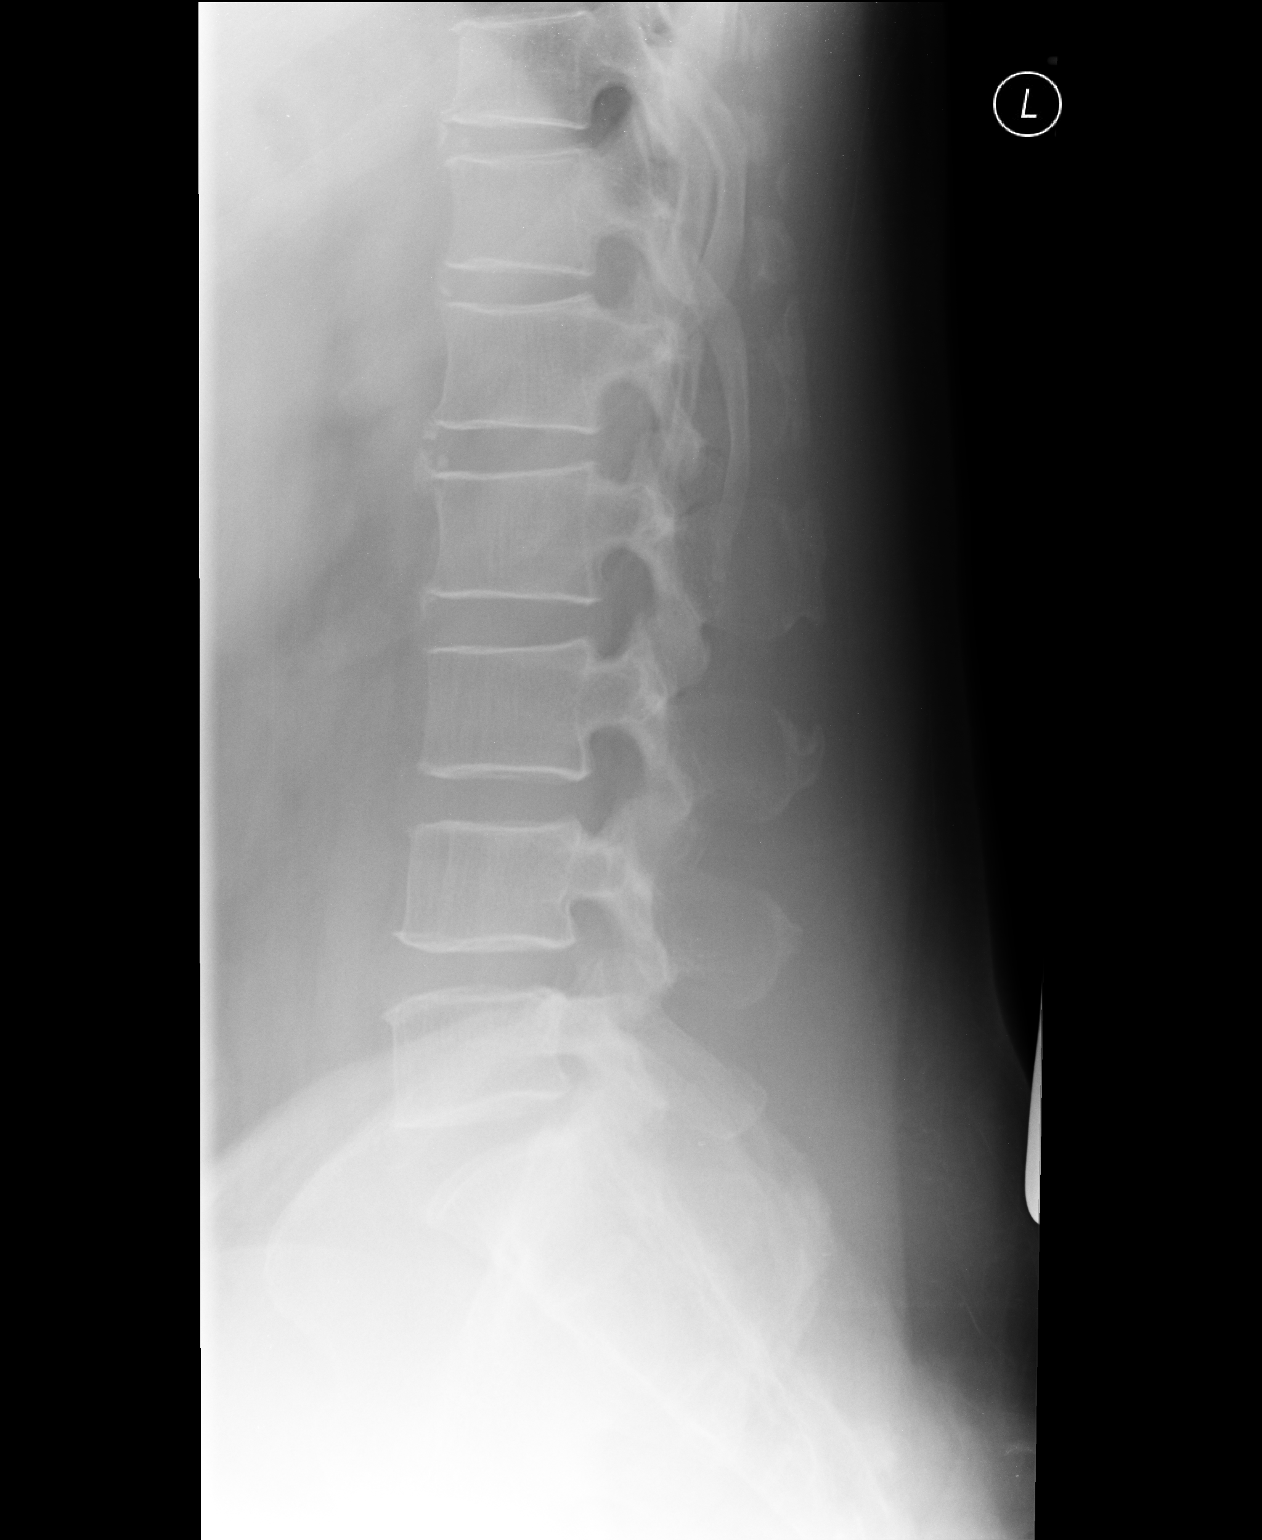

[l5 s1]
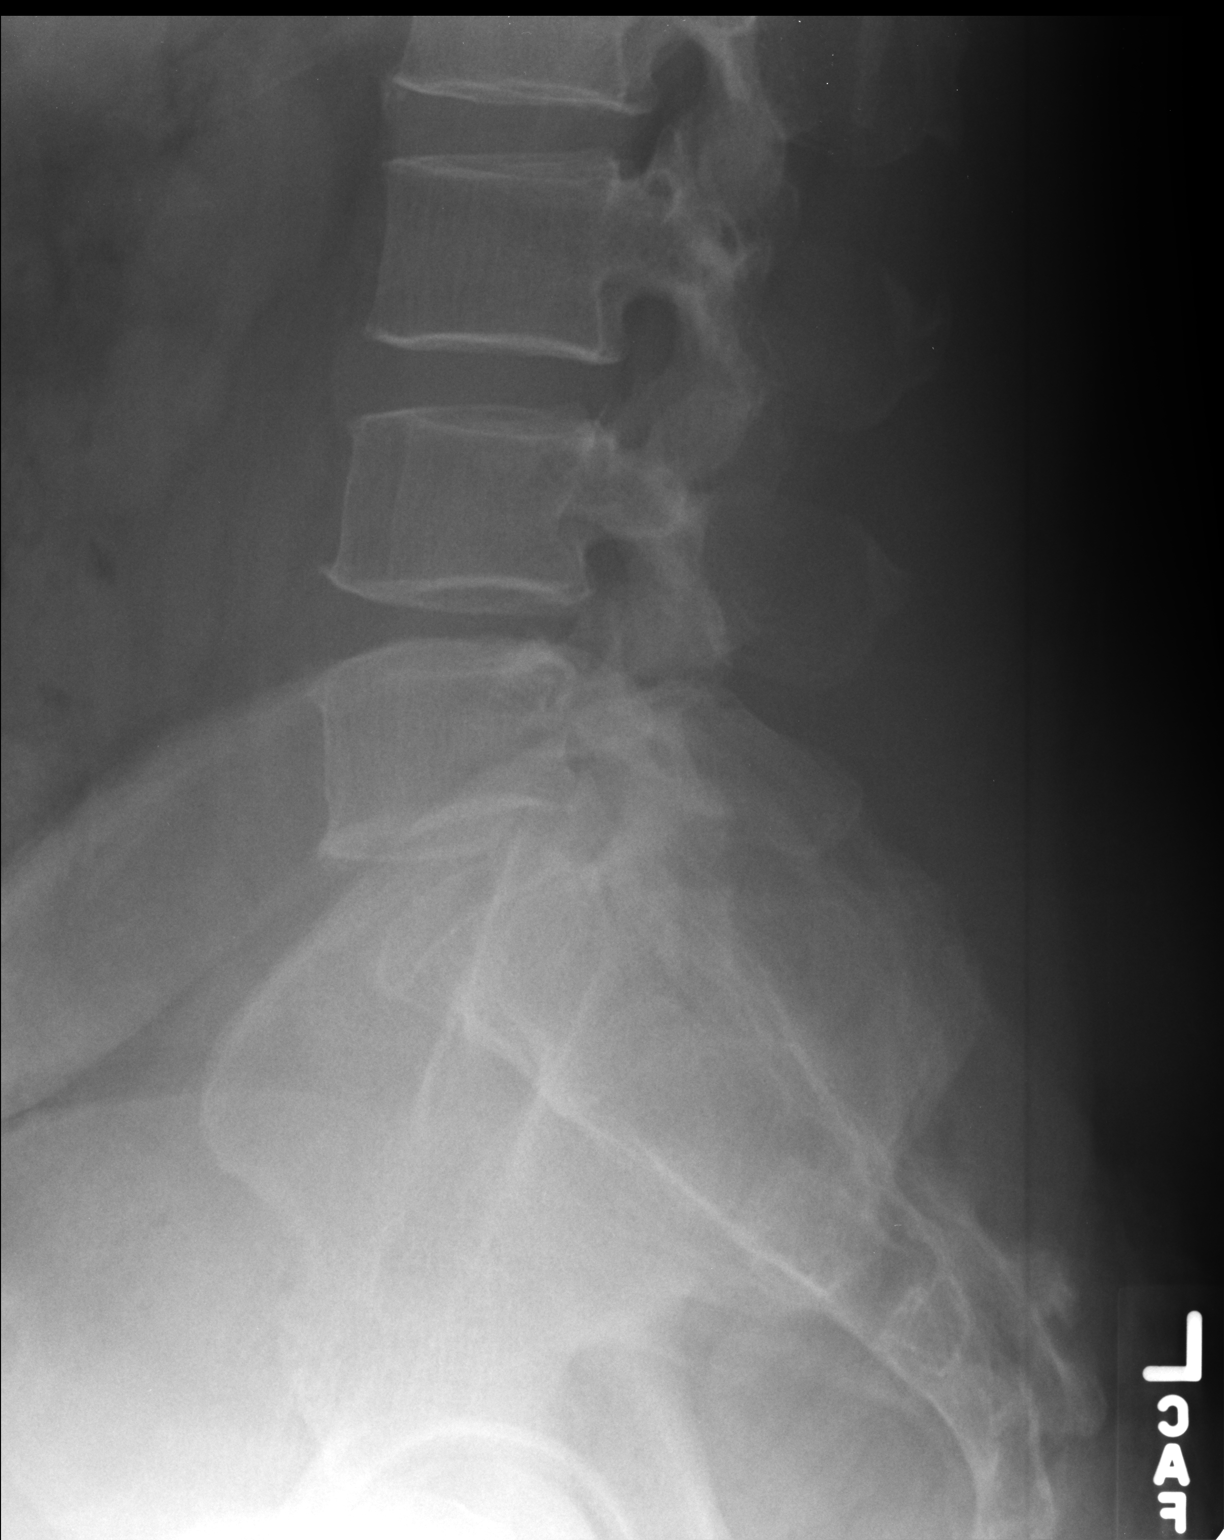

[3 of 3 positions shown; findings below may reference images not displayed]

FINDINGS: This report assumes 5 non rib-bearing lumbar vertebrae. Lumbar
vertebral body heights are preserved, with no fracture or suspicious
focal osseous lesion seen in the lumbar spine. There is
straightening of the lumbar spine. There is mild spondylosis
deformans throughout the lumbar spine, most prominent at L1-2, not
appreciably changed. Lumbar disc heights appear preserved. There is
minimal 2 mm retrolisthesis at L4-5. There is no appreciable facet
arthropathy.
IMPRESSION: 1. Mild spondylosis deformans in the lumbar spine.
2. Minimal 2 mm retrolisthesis at L4-5.

## 2016-11-22 ENCOUNTER — Telehealth: Payer: Self-pay

## 2016-11-22 NOTE — Telephone Encounter (Signed)
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  He no longer takes this medication.  Can we please write him a note explaining this?  [phone]

## 2016-11-26 NOTE — Telephone Encounter (Signed)
Ok to write letter that patient was prescribed cyclobenzaprine in 01/2016 for muscle strain, and that we have not prescribed it since then, and that in the event that he needs this medication (or other sedating medications) he will be advised against driving while using the medication.

## 2016-11-26 NOTE — Telephone Encounter (Signed)
Pt states he uses no medicines now. Had 1 rx for mobic and flexeril 01/2016. He needs a note saying he is on no meds, ok to write.

## 2016-11-27 NOTE — Telephone Encounter (Signed)
Note written and pt. Advised to pick up

## 2016-12-04 ENCOUNTER — Ambulatory Visit: Payer: BLUE CROSS/BLUE SHIELD

## 2016-12-21 ENCOUNTER — Ambulatory Visit (INDEPENDENT_AMBULATORY_CARE_PROVIDER_SITE_OTHER): Payer: Self-pay | Admitting: Physician Assistant

## 2016-12-21 VITALS — BP 136/88 | HR 114 | Temp 98.6°F | Resp 18 | Ht 71.5 in | Wt 280.0 lb

## 2016-12-21 DIAGNOSIS — Z0289 Encounter for other administrative examinations: Secondary | ICD-10-CM

## 2016-12-21 NOTE — Patient Instructions (Signed)
     IF you received an x-ray today, you will receive an invoice from [facility]. Please contact [facility] at [phone] with questions or concerns regarding your invoice.   IF you received labwork today, you will receive an invoice from [facility]. Please contact [facility] at [phone] with questions or concerns regarding your invoice.   Our billing staff will not be able to assist you with questions regarding bills from these companies.  You will be contacted with the lab results as soon as they are available. The fastest way to get your results is to activate your My Chart account. Instructions are located on the last page of this paperwork. If you have not heard from us regarding the results in 2 weeks, please contact this office.     

## 2017-01-07 NOTE — Progress Notes (Signed)
[facility] [address], [place] [address] [phone]  Date:  12/21/2016   Name:  Victor GottronKomi Richard [name]   DOB:  09/13/1971   MRN:  [id number]  PCP:  No PCP Per Patient    History of Present Illness:  Victor BargesKomi Richard [name] is a 46 y.o. male patient who [facility] No concerns or complaints at this time.     Patient Active Problem List   Diagnosis Date Noted  . Low back pain 12/11/2013    Past Medical History:  Diagnosis Date  . Back pain     History reviewed. No pertinent surgical history.  Social History  Substance Use Topics  . Smoking status: Never Smoker  . Smokeless tobacco: Never Used  . Alcohol use Yes    History reviewed. No pertinent family history.  No Known Allergies  Medication list has been reviewed and updated.  Current Outpatient Prescriptions on File Prior to Visit  Medication Sig Dispense Refill  . cyclobenzaprine (FLEXERIL) 10 MG tablet Take 1 tablet (10 mg total) by mouth 2 (two) times daily as needed for muscle spasms. (Patient not taking: Reported on 12/21/2016) 30 tablet 0  . meloxicam (MOBIC) 15 MG tablet Take 1 tablet (15 mg total) by mouth daily as needed for pain. (Patient not taking: Reported on 12/21/2016) 30 tablet 1   No current facility-administered medications on file prior to visit.     Review of Systems  Constitutional: Negative for chills and fever.  HENT: Negative for ear discharge, ear pain and sore throat.   Eyes: Negative for blurred vision and double vision.  Respiratory: Negative for cough, shortness of breath and wheezing.   Cardiovascular: Negative for chest pain, palpitations and leg swelling.  Gastrointestinal: Negative for diarrhea, nausea and vomiting.  Genitourinary: Negative for dysuria, frequency and hematuria.  Skin: Negative for itching and rash.  Neurological: Negative for dizziness and headaches.     Physical Examination: BP 136/88 (BP Location: Right Arm, Patient  Position: Sitting, Cuff Size: Large)   Pulse (!) 114   Temp 98.6 F (37 C) (Oral)   Resp 18   Ht 5' 11.5" (1.816 m)   Wt 280 lb (127 kg)   SpO2 97%   BMI 38.51 kg/m  Ideal Body Weight: Weight in (lb) to have BMI = 25: 181.4  Physical Exam  Constitutional: He is oriented to person, place, and time. He appears well-developed and well-nourished. No distress.  HENT:  Head: Normocephalic and atraumatic.  Right Ear: Tympanic membrane, external ear and ear canal normal.  Left Ear: Tympanic membrane, external ear and ear canal normal.  Eyes: Conjunctivae and EOM are normal. Pupils are equal, round, and reactive to light.  Cardiovascular: Normal rate and regular rhythm.  Exam reveals no friction rub.   No murmur heard. Pulmonary/Chest: Effort normal. No respiratory distress. He has no wheezes.  Abdominal: Soft. Bowel sounds are normal. He exhibits no distension and no mass. There is no tenderness.  Musculoskeletal: Normal range of motion. He exhibits no edema or tenderness.  Neurological: He is alert and oriented to person, place, and time. He displays normal reflexes.  Skin: Skin is warm and dry. He is not diaphoretic.  Psychiatric: He has a normal mood and affect. His behavior is normal.     Assessment and Plan: Victor [name] is a 46 y.o. male who is here today  There are no diagnoses linked to this encounter.  Trena PlattStephanie Dieter Hane, PA-C [facility] 01/07/2017 2:04 PM

## 2021-05-16 ENCOUNTER — Ambulatory Visit (HOSPITAL_COMMUNITY)
Admission: RE | Admit: 2021-05-16 | Discharge: 2021-05-16 | Disposition: A | Discharge status: Home / Self Care | Payer: Managed Care, Other (non HMO) | Source: Ambulatory Visit | Attending: Emergency Medicine | Admitting: Emergency Medicine

## 2021-05-16 ENCOUNTER — Encounter (HOSPITAL_COMMUNITY): Payer: Self-pay

## 2021-05-16 ENCOUNTER — Other Ambulatory Visit: Payer: Self-pay

## 2021-05-16 VITALS — BP 141/97 | HR 97 | Temp 100.0°F | Resp 18

## 2021-05-16 DIAGNOSIS — Z20822 Contact with and (suspected) exposure to covid-19: Secondary | ICD-10-CM | POA: Insufficient documentation

## 2021-05-16 DIAGNOSIS — R509 Fever, unspecified: Secondary | ICD-10-CM | POA: Insufficient documentation

## 2021-05-16 LAB — POC INFLUENZA A AND B ANTIGEN (URGENT CARE ONLY)
INFLUENZA A ANTIGEN, POC: NEGATIVE
INFLUENZA B ANTIGEN, POC: NEGATIVE

## 2021-05-16 LAB — SARS CORONAVIRUS 2 (TAT 6-24 HRS): SARS Coronavirus 2: NEGATIVE

## 2021-05-16 NOTE — Discharge Instructions (Addendum)
Can use 650 mg of tylenol every 6 hours or 400 mg of ibuprofen every 6 hours for fever  Fever is most likely caused by a virus and will go away on its on

## 2021-05-16 NOTE — ED Triage Notes (Signed)
Pt presents with fever xs 3 days. States fell down steps inside house 1 month ago. States believes the fever is from the fall. States using tylenol for the pain.

## 2021-05-16 NOTE — ED Provider Notes (Signed)
[facility]    CSN: [id number] Arrival date & time: 05/16/21  1449      History   Chief Complaint Chief Complaint  Patient presents with   Fever    APPT    HPI Victor [name] is a 50 y.o. male.   Patient presents with intermittent fever for three days resolving with use of tylenol. Had non- productive cough a few time yesterday but has resolved. Denies headache, ear pain or fullness, shortness of breath, chest pain, sore throat, chills, body aches. No known sick contacts. Had a fall one month ago, feels fever may related to the fall.   Past Medical History:  Diagnosis Date   Back pain     Patient Active Problem List   Diagnosis Date Noted   Low back pain 12/11/2013    History reviewed. No pertinent surgical history.     Home Medications    Prior to Admission medications   Medication Sig Start Date End Date Taking? Authorizing Provider  cyclobenzaprine (FLEXERIL) 10 MG tablet Take 1 tablet (10 mg total) by mouth 2 (two) times daily as needed for muscle spasms. Patient not taking: Reported on 12/21/2016 01/19/16   Emi Belfast, FNP  meloxicam (MOBIC) 15 MG tablet Take 1 tablet (15 mg total) by mouth daily as needed for pain. Patient not taking: Reported on 12/21/2016 01/19/16   Emi Belfast, FNP    Family History History reviewed. No pertinent family history.  Social History Social History   Tobacco Use   Smoking status: Never   Smokeless tobacco: Never  Substance Use Topics   Alcohol use: Yes   Drug use: No     Allergies   Patient has no known allergies.   Review of Systems Review of Systems  Constitutional:  Positive for fever. Negative for activity change, appetite change, chills, diaphoresis, fatigue and unexpected weight change.  HENT: Negative.    Respiratory: Negative.    Cardiovascular: Negative.   Genitourinary: Negative.   Musculoskeletal: Negative.   Skin: Negative.   Neurological: Negative.      Physical  Exam Triage Vital Signs ED Triage Vitals  Enc Vitals Group     BP 05/16/21 1520 (!) 141/97     Pulse Rate 05/16/21 1520 97     Resp 05/16/21 1520 18     Temp 05/16/21 1520 100 F (37.8 C)     Temp Source 05/16/21 1520 Oral     SpO2 05/16/21 1520 98 %     Weight --      Height --      Head Circumference --      Peak Flow --      Pain Score 05/16/21 1517 4     Pain Loc --      Pain Edu? --      Excl. in GC? --    No data found.  Updated Vital Signs BP (!) 141/97 (BP Location: Right Arm)   Pulse 97   Temp 100 F (37.8 C) (Oral)   Resp 18   SpO2 98%   Visual Acuity Right Eye Distance:   Left Eye Distance:   Bilateral Distance:    Right Eye Near:   Left Eye Near:    Bilateral Near:     Physical Exam Constitutional:      Appearance: Normal appearance. He is obese.  HENT:     Head: Normocephalic.     Right Ear: Tympanic membrane, ear canal and external ear normal.  Left Ear: Tympanic membrane, ear canal and external ear normal.     Nose: Nose normal.     Mouth/Throat:     Mouth: Mucous membranes are moist.     Pharynx: Oropharynx is clear.  Eyes:     Extraocular Movements: Extraocular movements intact.     Conjunctiva/sclera: Conjunctivae normal.     Pupils: Pupils are equal, round, and reactive to light.  Cardiovascular:     Rate and Rhythm: Normal rate and regular rhythm.     Pulses: Normal pulses.     Heart sounds: Normal heart sounds.  Pulmonary:     Effort: Pulmonary effort is normal.     Breath sounds: Normal breath sounds.  Musculoskeletal:     Cervical back: Normal range of motion and neck supple.  Skin:    General: Skin is warm and dry.  Neurological:     Mental Status: He is alert and oriented to person, place, and time. Mental status is at baseline.  Psychiatric:        Mood and Affect: Mood normal.        Behavior: Behavior normal.     UC Treatments / Results  Labs (all labs ordered are listed, but only abnormal results are  displayed) Labs Reviewed  SARS CORONAVIRUS 2 (TAT 6-24 HRS)  POC INFLUENZA A AND B ANTIGEN (URGENT CARE ONLY)    EKG   Radiology No results found.  Procedures Procedures (including critical care time)  Medications Ordered in UC Medications - No data to display  Initial Impression / Assessment and Plan / UC Course  I have reviewed the triage vital signs and the nursing notes.  Pertinent labs & imaging results that were available during my care of the patient were reviewed by me and considered in my medical decision making (see chart for details).  Fever  Covid test pending Flu test negative Discussed etiology of fever with patient, most likely viral, verbalized understanding, continue use of otc medication for management   Final Clinical Impressions(s) / UC Diagnoses   Final diagnoses:  Fever, unspecified     Discharge Instructions      Can use 650 mg of tylenol every 6 hours or 400 mg of ibuprofen every 6 hours for fever  Fever is most likely caused by a virus and will go away on its on    ED Prescriptions   None    PDMP not reviewed this encounter.   Valinda Hoar, NP 05/16/21 1554

## 2024-05-22 ENCOUNTER — Ambulatory Visit (INDEPENDENT_AMBULATORY_CARE_PROVIDER_SITE_OTHER): Payer: Self-pay | Admitting: Family Medicine

## 2024-05-22 ENCOUNTER — Encounter: Payer: Self-pay | Admitting: Family Medicine

## 2024-05-22 VITALS — BP 179/106 | HR 79 | Ht 73.0 in | Wt 302.0 lb

## 2024-05-22 DIAGNOSIS — Z6839 Body mass index (BMI) 39.0-39.9, adult: Secondary | ICD-10-CM | POA: Diagnosis not present

## 2024-05-22 DIAGNOSIS — E66812 Obesity, class 2: Secondary | ICD-10-CM

## 2024-05-22 DIAGNOSIS — N529 Male erectile dysfunction, unspecified: Secondary | ICD-10-CM | POA: Diagnosis not present

## 2024-05-22 DIAGNOSIS — Z0000 Encounter for general adult medical examination without abnormal findings: Secondary | ICD-10-CM

## 2024-05-22 DIAGNOSIS — I10 Essential (primary) hypertension: Secondary | ICD-10-CM | POA: Diagnosis not present

## 2024-05-22 DIAGNOSIS — R9431 Abnormal electrocardiogram [ECG] [EKG]: Secondary | ICD-10-CM

## 2024-05-22 MED ORDER — VALSARTAN 40 MG PO TABS: 40.0000 mg | ORAL_TABLET | Freq: Every day | ORAL | 3 refills | Status: DC

## 2024-05-22 NOTE — Progress Notes (Signed)
 New Patient Office Visit  Subjective    Patient ID: Victor [name], male    DOB: 05-Jul-1971  Age: 53 y.o. MRN: [id number]  CC:  Chief Complaint  Patient presents with   Establish Care    HPI Victor [name] presents to establish care. He lives with wife and kids. He is a Media planner.    Discussed the use of AI scribe software for clinical note transcription with the patient, who gave verbal consent to proceed.  History of Present Illness Victor [name] is a 53 year old who presents with erectile dysfunction and high blood pressure.  He has been experiencing erectile dysfunction for the past two to three years. Initially, he was prescribed tadalafil (Cialis) at an urgent care facility, which was effective. However, the issue has persisted, and he describes it as 'very hard to get an erection.' No associated urinary symptoms are reported. He does not currently have any medication.  He recalls being told he had high blood pressure sometimes and describes an episode seven to eight years ago when his blood pressure was very high. He has not been on any medication for hypertension and does not regularly monitor his blood pressure. He recalls an episode seven to eight years ago when his blood pressure was very high, and he was given medication at a [name], but the blood pressure increased further after three days. No follow-up regarding this. He attributes his high blood pressure to stress from his job as a Media planner. No symptoms such as chest pain, headaches, dizziness, or unusual fatigue are present.  He lives with his wife and children and works as a Media planner. He occasionally consumes alcohol and denies the use of recreational drugs or tobacco. He has no known drug allergies and is not currently taking any medications. There is no family history of high blood pressure, heart attack, or stroke.      Outpatient Encounter Medications as of 05/22/2024  Medication Sig    valsartan  (DIOVAN ) 40 MG tablet Take 1 tablet (40 mg total) by mouth daily.   [DISCONTINUED] cyclobenzaprine  (FLEXERIL ) 10 MG tablet Take 1 tablet (10 mg total) by mouth 2 (two) times daily as needed for muscle spasms. (Patient not taking: Reported on 12/21/2016)   [DISCONTINUED] meloxicam  (MOBIC ) 15 MG tablet Take 1 tablet (15 mg total) by mouth daily as needed for pain. (Patient not taking: Reported on 12/21/2016)   No facility-administered encounter medications on file as of 05/22/2024.    Past Medical History:  Diagnosis Date   Back pain     History reviewed. No pertinent surgical history.  History reviewed. No pertinent family history.  Social History   Socioeconomic History   Marital status: Married    Spouse name: Not on file   Number of children: Not on file   Years of education: Not on file   Highest education level: Not on file  Occupational History   Occupation: taxi driver  Tobacco Use   Smoking status: Never   Smokeless tobacco: Never  Substance and Sexual Activity   Alcohol use: Yes    Comment: occasionally   Drug use: No   Sexual activity: Not on file  Other Topics Concern   Not on file  Social History Narrative   Not on file   Social Drivers of Health   Financial Resource Strain: Not on file  Food Insecurity: Not on file  Transportation Needs: Not on file  Physical Activity: Not on file  Stress:  Not on file  Social Connections: Not on file  Intimate Partner Violence: Not on file    ROS All review of systems negative except what is listed in the HPI      Objective    BP (!) 179/106   Pulse 79   Ht 6' 1 (1.854 m)   Wt (!) 302 lb (137 kg)   SpO2 97%   BMI 39.84 kg/m   Physical Exam Vitals reviewed.  Constitutional:      Appearance: Normal appearance. He is obese.  Cardiovascular:     Rate and Rhythm: Normal rate and regular rhythm.     Heart sounds: Normal heart sounds.  Pulmonary:     Effort: Pulmonary effort is normal.      Breath sounds: Normal breath sounds.  Skin:    General: Skin is warm and dry.  Neurological:     Mental Status: He is alert and oriented to person, place, and time.  Psychiatric:        Mood and Affect: Mood normal.        Behavior: Behavior normal.        Thought Content: Thought content normal.        Judgment: Judgment normal.     EKG: NSR 77 bpm, left axis deviation      Assessment & Plan:   Problem List Items Addressed This Visit       Active Problems   Uncontrolled hypertension - Primary   Severely elevated blood pressure. EKG shows left axis deviation, likely due to hypertension. Explained risk of myocardial infarction or cerebrovascular accident.  Declined ED. Asymptomatic. Strict ED precautions. Early follow-up next week to ensure some response to valsartan  and adjust as needed. Labs today. Cardiology referral.      Relevant Medications   valsartan  (DIOVAN ) 40 MG tablet   Other Relevant Orders   EKG 12-Lead   CBC with Differential/Platelet   Comprehensive metabolic panel with GFR   Lipid panel   TSH   Ambulatory referral to Cardiology   Class 2 obesity with body mass index (BMI) of 39.0 to 39.9 in adult   Labs today Recommend heart healthy diet, regular exercise, weight management.       Relevant Orders   Lipid panel   TSH   Hemoglobin A1c   Erectile dysfunction   Chronic erectile dysfunction for 2-3 years. Previously treated with tadalafil with some success. We can address this once BP has stabilized.       Other Visit Diagnoses       Encounter for medical examination to establish care         Abnormal EKG       Relevant Orders   Ambulatory referral to Cardiology            Return for HTN follow-up early next week.   Victor [name] Mon, NP

## 2024-05-22 NOTE — Assessment & Plan Note (Addendum)
 Severely elevated blood pressure. EKG shows left axis deviation, likely due to hypertension. Explained risk of myocardial infarction or cerebrovascular accident.  Declined ED. Asymptomatic. Strict ED precautions. Early follow-up next week to ensure some response to valsartan  and adjust as needed. Labs today. Cardiology referral.

## 2024-05-22 NOTE — Assessment & Plan Note (Signed)
 Chronic erectile dysfunction for 2-3 years. Previously treated with tadalafil with some success. We can address this once BP has stabilized.

## 2024-05-22 NOTE — Assessment & Plan Note (Signed)
 Labs today Recommend heart healthy diet, regular exercise, weight management.

## 2024-05-27 ENCOUNTER — Ambulatory Visit (INDEPENDENT_AMBULATORY_CARE_PROVIDER_SITE_OTHER): Admitting: Family Medicine

## 2024-05-27 ENCOUNTER — Encounter: Payer: Self-pay | Admitting: Family Medicine

## 2024-05-27 VITALS — BP 164/92 | HR 85 | Ht 73.0 in | Wt 303.0 lb

## 2024-05-27 DIAGNOSIS — I10 Essential (primary) hypertension: Secondary | ICD-10-CM | POA: Diagnosis not present

## 2024-05-27 NOTE — Assessment & Plan Note (Signed)
 He has only tried the valsartan  for 5 days, but some improvement. Still not at goal. Continue valsartan  and lifestyle measures. Follow-up in 2 weeks to reassess and adjust dose if needed.

## 2024-05-27 NOTE — Progress Notes (Signed)
   Established Patient Office Visit  Subjective   Patient ID: Victor [name], male    DOB: 12-12-1970  Age: 53 y.o. MRN: [id number]  Chief Complaint  Patient presents with   Medical Management of Chronic Issues   Hypertension    Patient is here for HTN follow-up after establishing care last week with significantly elevated BP.   Hypertension: - Medications: valsartan  40 mg daily - Compliance: good - Checking BP at home: no - Denies any SOB, recurrent headaches, CP, vision changes, LE edema, dizziness, palpitations, or medication side effects. - Diet: general - Exercise: minimal - Stressors: work  BP Readings from Last 3 Encounters:  05/27/24 (!) 164/92  05/22/24 (!) 179/106  05/16/21 (!) 141/97          ROS All review of systems negative except what is listed in the HPI    Objective:     BP (!) 164/92   Pulse 85   Ht 6' 1 (1.854 m)   Wt (!) 303 lb (137.4 kg)   SpO2 98%   BMI 39.98 kg/m    164/92  Physical Exam Vitals reviewed.  Constitutional:      Appearance: Normal appearance. He is obese.  Cardiovascular:     Rate and Rhythm: Normal rate and regular rhythm.     Heart sounds: Normal heart sounds.  Pulmonary:     Effort: Pulmonary effort is normal.     Breath sounds: Normal breath sounds.  Skin:    General: Skin is warm and dry.  Neurological:     Mental Status: He is alert and oriented to person, place, and time.  Psychiatric:        Mood and Affect: Mood normal.        Behavior: Behavior normal.        Thought Content: Thought content normal.        Judgment: Judgment normal.      No results found for any visits on 05/27/24.    The ASCVD Risk score (Arnett DK, et al., 2019) failed to calculate for the following reasons:   Cannot find a previous HDL lab   Cannot find a previous total cholesterol lab    Assessment & Plan:   Problem List Items Addressed This Visit       Active Problems   Uncontrolled hypertension -  Primary   He has only tried the valsartan  for 5 days, but some improvement. Still not at goal. Continue valsartan  and lifestyle measures. Follow-up in 2 weeks to reassess and adjust dose if needed.       He didn't get previous labs drawn, asked him to get these done today. Walked him to the lab.       Return in about 2 weeks (around 06/10/2024) for HTN follow-up.    Waddell KATHEE Mon, NP

## 2024-06-10 ENCOUNTER — Other Ambulatory Visit

## 2024-06-10 ENCOUNTER — Encounter: Payer: Self-pay | Admitting: Family Medicine

## 2024-06-10 ENCOUNTER — Ambulatory Visit (INDEPENDENT_AMBULATORY_CARE_PROVIDER_SITE_OTHER): Admitting: Family Medicine

## 2024-06-10 VITALS — BP 162/99 | HR 85 | Ht 73.0 in | Wt 299.0 lb

## 2024-06-10 DIAGNOSIS — I10 Essential (primary) hypertension: Secondary | ICD-10-CM | POA: Diagnosis not present

## 2024-06-10 DIAGNOSIS — Z6839 Body mass index (BMI) 39.0-39.9, adult: Secondary | ICD-10-CM

## 2024-06-10 LAB — TSH: TSH: 1.92 u[IU]/mL (ref 0.35–5.50)

## 2024-06-10 LAB — LIPID PANEL
Cholesterol: 191 mg/dL (ref 0–200)
HDL: 55.5 mg/dL (ref >39.00)
LDL Cholesterol: 110 mg/dL — ABNORMAL HIGH (ref 0–99)
NonHDL: 135.07
Total CHOL/HDL Ratio: 3
Triglycerides: 124 mg/dL (ref 0.0–149.0)
VLDL: 24.8 mg/dL (ref 0.0–40.0)

## 2024-06-10 LAB — CBC WITH DIFFERENTIAL/PLATELET
Basophils Absolute: 0.1 K/uL (ref 0.0–0.1)
Basophils Relative: 0.9 % (ref 0.0–3.0)
Eosinophils Absolute: 0 K/uL (ref 0.0–0.7)
Eosinophils Relative: 0.4 % (ref 0.0–5.0)
HCT: 48.3 % (ref 39.0–52.0)
Hemoglobin: 16.1 g/dL (ref 13.0–17.0)
Lymphocytes Relative: 43.6 % (ref 12.0–46.0)
Lymphs Abs: 2.7 K/uL (ref 0.7–4.0)
MCHC: 33.4 g/dL (ref 30.0–36.0)
MCV: 77.2 fl — ABNORMAL LOW (ref 78.0–100.0)
Monocytes Absolute: 0.5 K/uL (ref 0.1–1.0)
Monocytes Relative: 8.4 % (ref 3.0–12.0)
Neutro Abs: 2.9 K/uL (ref 1.4–7.7)
Neutrophils Relative %: 46.7 % (ref 43.0–77.0)
Platelets: 210 K/uL (ref 150.0–400.0)
RBC: 6.25 Mil/uL — ABNORMAL HIGH (ref 4.22–5.81)
RDW: 15.3 % (ref 11.5–15.5)
WBC: 6.3 K/uL (ref 4.0–10.5)

## 2024-06-10 LAB — COMPREHENSIVE METABOLIC PANEL WITH GFR
ALT: 29 U/L (ref 0–53)
AST: 18 U/L (ref 0–37)
Albumin: 4.4 g/dL (ref 3.5–5.2)
Alkaline Phosphatase: 56 U/L (ref 39–117)
BUN: 9 mg/dL (ref 6–23)
CO2: 30 meq/L (ref 19–32)
Calcium: 9.8 mg/dL (ref 8.4–10.5)
Chloride: 101 meq/L (ref 96–112)
Creatinine, Ser: 0.99 mg/dL (ref 0.40–1.50)
GFR: 86.78 mL/min (ref >60.00)
Glucose, Bld: 198 mg/dL — ABNORMAL HIGH (ref 70–99)
Potassium: 4.4 meq/L (ref 3.5–5.1)
Sodium: 139 meq/L (ref 135–145)
Total Bilirubin: 1.1 mg/dL (ref 0.2–1.2)
Total Protein: 7.3 g/dL (ref 6.0–8.3)

## 2024-06-10 MED ORDER — AMLODIPINE BESYLATE 5 MG PO TABS: 5.0000 mg | ORAL_TABLET | Freq: Every day | ORAL | 3 refills | Status: AC

## 2024-06-10 NOTE — Progress Notes (Signed)
   Established Patient Office Visit  Subjective   Patient ID: Victor [name], male    DOB: 07/15/1971  Age: 53 y.o. MRN: [id number]  Chief Complaint  Patient presents with   Medical Management of Chronic Issues   Hypertension    Patient is here for HTN follow-up.   Hypertension: - Medications: valsartan  40 mg daily - Compliance: good - Checking BP at home: no - Denies any SOB, recurrent headaches, CP, vision changes, LE edema, dizziness, palpitations, or medication side effects. - Diet: general - Exercise: minimal - Stressors: work, taxi driver    BP Readings from Last 3 Encounters:  06/10/24 (!) 162/99  05/27/24 (!) 164/92  05/22/24 (!) 179/106      ROS All review of systems negative except what is listed in the HPI    Objective:     BP (!) 162/99   Pulse 85   Ht 6' 1 (1.854 m)   Wt 299 lb (135.6 kg)   SpO2 98%   BMI 39.45 kg/m    Physical Exam Vitals reviewed.  Constitutional:      Appearance: Normal appearance. He is obese.  Cardiovascular:     Rate and Rhythm: Normal rate and regular rhythm.     Heart sounds: Normal heart sounds.  Pulmonary:     Effort: Pulmonary effort is normal.     Breath sounds: Normal breath sounds.  Skin:    General: Skin is warm and dry.  Neurological:     Mental Status: He is alert and oriented to person, place, and time.  Psychiatric:        Mood and Affect: Mood normal.        Behavior: Behavior normal.        Thought Content: Thought content normal.        Judgment: Judgment normal.      No results found for any visits on 06/10/24.    The ASCVD Risk score (Arnett DK, et al., 2019) failed to calculate for the following reasons:   Cannot find a previous HDL lab   Cannot find a previous total cholesterol lab    Assessment & Plan:   Problem List Items Addressed This Visit       Active Problems   Uncontrolled hypertension - Primary   Blood pressure is not at goal for age and co-morbidities.    Recommendations: continue valsartan  40 mg daily, add amlodipine  5 mg daily - BP goal <130/80 - monitor and log blood pressures at home - check around the same time each day in a relaxed setting - Limit salt to <2000 mg/day - Follow DASH eating plan (heart healthy diet) - limit alcohol to 2 standard drinks per day for men and 1 per day for women - avoid tobacco products - get at least 2 hours of regular aerobic exercise weekly Patient aware of signs/symptoms requiring further/urgent evaluation. Labs updated today - previously ordered, but he did not stop at lab last week. Waked him to lab myself; he agreed to have them done today. Follow-up in 1 week to reassess and adjust meds. Go to the ED if any symptoms develop.        Relevant Medications   amLODipine  (NORVASC ) 5 MG tablet      Return in about 1 week (around 06/17/2024) for HTN follow-up.    Waddell KATHEE Mon, NP

## 2024-06-10 NOTE — Assessment & Plan Note (Addendum)
 Blood pressure is not at goal for age and co-morbidities.   Recommendations: continue valsartan  40 mg daily, add amlodipine  5 mg daily - BP goal <130/80 - monitor and log blood pressures at home - check around the same time each day in a relaxed setting - Limit salt to <2000 mg/day - Follow DASH eating plan (heart healthy diet) - limit alcohol to 2 standard drinks per day for men and 1 per day for women - avoid tobacco products - get at least 2 hours of regular aerobic exercise weekly Patient aware of signs/symptoms requiring further/urgent evaluation. Labs updated today - previously ordered, but he did not stop at lab last week. Waked him to lab myself; he agreed to have them done today. Follow-up in 1 week to reassess and adjust meds. Go to the ED if any symptoms develop.

## 2024-06-11 ENCOUNTER — Ambulatory Visit: Payer: Self-pay | Admitting: Family

## 2024-06-11 ENCOUNTER — Other Ambulatory Visit: Payer: Self-pay | Admitting: Family

## 2024-06-11 LAB — HEMOGLOBIN A1C
Est. average glucose Bld gHb Est-mCnc: 217 mg/dL
Hgb A1c MFr Bld: 9.2 % — ABNORMAL HIGH (ref 4.8–5.6)

## 2024-06-11 MED ORDER — METFORMIN HCL 500 MG PO TABS: 500.0000 mg | ORAL_TABLET | Freq: Every day | ORAL | 3 refills | Status: DC

## 2024-06-12 ENCOUNTER — Telehealth: Payer: Self-pay | Admitting: Neurology

## 2024-06-12 NOTE — Telephone Encounter (Signed)
 Copied from CRM [id number]. Topic: Clinical - Medication Question >> Jun 12, 2024 10:28 AM Victor [name] wrote: Wants to know if he can take both prescriptions together- valsartan  (DIOVAN ) 40 MG tablet, He's going to pick up the new one today he doesn't know the name. Wants to know how to use medication.    Spoke with patient and instructed on medication use. All questions answered.

## 2024-06-18 ENCOUNTER — Ambulatory Visit: Admitting: Physician Assistant

## 2024-06-18 ENCOUNTER — Encounter: Payer: Self-pay | Admitting: Physician Assistant

## 2024-06-18 VITALS — BP 156/100 | HR 85 | Ht 73.0 in | Wt 299.8 lb

## 2024-06-18 DIAGNOSIS — E119 Type 2 diabetes mellitus without complications: Secondary | ICD-10-CM | POA: Diagnosis not present

## 2024-06-18 DIAGNOSIS — I10 Essential (primary) hypertension: Secondary | ICD-10-CM

## 2024-06-18 DIAGNOSIS — Z7984 Long term (current) use of oral hypoglycemic drugs: Secondary | ICD-10-CM

## 2024-06-18 MED ORDER — LANCET DEVICE MISC: 1.0000 | Freq: Three times a day (TID) | 0 refills | Status: AC

## 2024-06-18 MED ORDER — BLOOD GLUCOSE TEST VI STRP: 1.0000 | ORAL_STRIP | Freq: Three times a day (TID) | 0 refills | Status: AC

## 2024-06-18 MED ORDER — LANCETS MISC. MISC: 1.0000 | Freq: Three times a day (TID) | 0 refills | Status: AC

## 2024-06-18 MED ORDER — BLOOD GLUCOSE MONITORING SUPPL DEVI: 1.0000 | Freq: Three times a day (TID) | 0 refills | Status: AC

## 2024-06-18 NOTE — Assessment & Plan Note (Addendum)
 Found at A1c 9.2% Covering provider started metformin  500 mg daily, advised pt of titration schedule to get to goal daily dose of 2000mg .  Reviewed the differences between his medicines and that they each serve a different purpose, the metformin  does not replace the valsartan  and we are recommending he take all three.  Reviewed the dx of DM, MOA, etiology.  Discussed the importance of diet, exercise, and regular monitoring to prevent complications such as neuropathy, kidney damage, and retinopathy. - Refer to nutritionist for dietary management education. -rx glucometer.  -ordering uacr.  F/b 2 weeks w/ pcp

## 2024-06-18 NOTE — Patient Instructions (Addendum)
 Increase metformin  :  500 mg once daily for 7 days, then  500 mg twice daily for 7 days, then 1000 mg ( 2 tablets) twice daily

## 2024-06-18 NOTE — Progress Notes (Signed)
 Established patient visit   Patient: Victor [name]   DOB: Oct 27, 1971   53 y.o. Male  MRN: [id number] Visit Date: 06/18/2024  Today's healthcare provider: Manuelita Flatness, PA-C   Cc. New dx dm, htn f/u  Subjective     Pt presents for HTN f/u and to discuss a new dx of type 2 diabetes.  Pt denies any questions today. He does mention when he started the metformin , he stopped valsartan  as he felt it was not appropriate to take all three medicines.  Medications: Outpatient Medications Prior to Visit  Medication Sig   amLODipine  (NORVASC ) 5 MG tablet Take 1 tablet (5 mg total) by mouth daily.   metFORMIN  (GLUCOPHAGE ) 500 MG tablet Take 1 tablet (500 mg total) by mouth daily with breakfast.   valsartan  (DIOVAN ) 40 MG tablet Take 1 tablet (40 mg total) by mouth daily.   No facility-administered medications prior to visit.    Review of Systems  Constitutional:  Negative for fatigue and fever.  Respiratory:  Negative for cough and shortness of breath.   Cardiovascular:  Negative for chest pain, palpitations and leg swelling.  Neurological:  Negative for dizziness and headaches.       Objective    BP (!) 156/100   Pulse 85   Ht 6' 1 (1.854 m)   Wt 299 lb 12.8 oz (136 kg)   BMI 39.55 kg/m    Physical Exam Constitutional:      General: He is awake.     Appearance: He is well-developed.  HENT:     Head: Normocephalic.  Eyes:     Conjunctiva/sclera: Conjunctivae normal.  Cardiovascular:     Rate and Rhythm: Normal rate and regular rhythm.     Heart sounds: Normal heart sounds.  Pulmonary:     Effort: Pulmonary effort is normal.  Skin:    General: Skin is warm.  Neurological:     Mental Status: He is alert and oriented to person, place, and time.  Psychiatric:        Attention and Perception: Attention normal.        Mood and Affect: Mood normal.        Speech: Speech normal.        Behavior: Behavior is cooperative.      No results found for any  visits on 06/18/24.  Assessment & Plan    New onset type 2 diabetes mellitus (HCC) Assessment & Plan: Found at A1c 9.2% Covering provider started metformin  500 mg daily, advised pt of titration schedule to get to goal daily dose of 2000mg .  Reviewed the differences between his medicines and that they each serve a different purpose, the metformin  does not replace the valsartan  and we are recommending he take all three.  Reviewed the dx of DM, MOA, etiology.  Discussed the importance of diet, exercise, and regular monitoring to prevent complications such as neuropathy, kidney damage, and retinopathy. - Refer to nutritionist for dietary management education. -rx glucometer.  -ordering uacr.  F/b 2 weeks w/ pcp  Orders: -     Microalbumin / creatinine urine ratio -     Amb ref to Medical Nutrition Therapy-MNT -     Blood Glucose Monitoring Suppl; 1 each by Does not apply route in the morning, at noon, and at bedtime. May substitute to any manufacturer covered by patient's insurance.  Dispense: 1 each; Refill: 0 -     Blood Glucose Test; 1 each by In Vitro route in  the morning, at noon, and at bedtime. May substitute to any manufacturer covered by patient's insurance.  Dispense: 100 strip; Refill: 0 -     Lancet Device; 1 each by Does not apply route in the morning, at noon, and at bedtime. May substitute to any manufacturer covered by patient's insurance.  Dispense: 1 each; Refill: 0 -     Lancets Misc.; 1 each by Does not apply route in the morning, at noon, and at bedtime. May substitute to any manufacturer covered by patient's insurance.  Dispense: 100 each; Refill: 0  Uncontrolled hypertension Assessment & Plan: Managed with valsartan  40 mg, amlodipine  5 mg -- pt self d/c vasartan 40 mg  Emphasized the importance of adherence to both medications  Encouraged pt to check bp at home  F/b 2 weeks w/ pcp    Return in about 2 weeks (around 07/02/2024) for DMII, hypertension.        Manuelita Flatness, PA-C  [facility] [phone] (phone) [phone] (fax)  [facility]

## 2024-06-18 NOTE — Assessment & Plan Note (Addendum)
 Managed with valsartan  40 mg, amlodipine  5 mg -- pt self d/c vasartan 40 mg  Emphasized the importance of adherence to both medications  Encouraged pt to check bp at home  F/b 2 weeks w/ pcp

## 2024-07-03 ENCOUNTER — Ambulatory Visit: Admitting: Family Medicine

## 2024-07-20 ENCOUNTER — Telehealth: Payer: Self-pay

## 2024-07-20 NOTE — Telephone Encounter (Signed)
 Called patient. Did not want to discuss anything with me. Wants provider to call him, set up virtual appt.

## 2024-07-20 NOTE — Telephone Encounter (Signed)
 Copied from CRM [id number]. Topic: Clinical - Medication Question >> Jul 20, 2024 12:34 PM Carlyon D wrote: Reason for CRM: pt would like a call back from provider in regards to medication questions he has please reach out to pt.

## 2024-07-22 ENCOUNTER — Telehealth (INDEPENDENT_AMBULATORY_CARE_PROVIDER_SITE_OTHER): Admitting: Family Medicine

## 2024-07-22 ENCOUNTER — Encounter: Payer: Self-pay | Admitting: Family Medicine

## 2024-07-22 DIAGNOSIS — N529 Male erectile dysfunction, unspecified: Secondary | ICD-10-CM

## 2024-07-22 MED ORDER — SILDENAFIL CITRATE 50 MG PO TABS: 50.0000 mg | ORAL_TABLET | Freq: Every day | ORAL | 1 refills | Status: DC | PRN

## 2024-07-22 NOTE — Progress Notes (Signed)
 Virtual Video Visit via MyChart Note  I connected with  [name] on 07/22/24 at  9:20 AM EDT by the video enabled telemedicine application for MyChart, and verified that I am speaking with the correct person using two identifiers.   I introduced myself as a Publishing rights manager with the practice. We discussed the limitations of evaluation and management by telemedicine and the availability of in person appointments. The patient expressed understanding and agreed to proceed.  Participating parties in this visit include: The patient and the nurse practitioner listed.  The patient is: At home I am: In the office - [facility]  Subjective:    CC: erectile dysfunction   HPI: Victor [name] is a 53 y.o. year old male presenting today via MyChart today for erectile dysfunction.    Discussed the use of AI scribe software for clinical note transcription with the patient, who gave verbal consent to proceed.  History of Present Illness Victor [name] is a 53 year old with erectile dysfunction who presents for worsening symptoms.  He has experienced a worsening of erectile dysfunction symptoms since his last visit. Previously, he tried Cialis, which he thinks was effective, but he discontinued its use, unsure why. He is now seeking alternative treatments that may be more effective. Denies any other urologic symptoms.          Past medical history, Surgical history, Family history not pertinant except as noted below, Social history, Allergies, and medications have been entered into the medical record, reviewed, and corrections made.   Review of Systems:  All review of systems negative except what is listed in the HPI   Objective:    General:  Speaking clearly in complete sentences. Absent shortness of breath noted.   Alert and oriented x3.   Normal judgment.  Absent acute distress.   Impression and Recommendations:     Problem List Items Addressed This Visit       Active Problems   Erectile dysfunction - Primary   Relevant Medications   sildenafil  (VIAGRA ) 50 MG tablet    Assessment & Plan Erectile dysfunction - Prescribed sildenafil  - medication discussed - Consider urologist referral if symptoms worsen.      Follow-up if symptoms worsen or fail to improve.    I discussed the assessment and treatment plan with the patient. The patient was provided an opportunity to ask questions and all were answered. The patient agreed with the plan and demonstrated an understanding of the instructions.   The patient was advised to call back or seek an in-person evaluation if the symptoms worsen or if the condition fails to improve as anticipated.   Waddell KATHEE Mon, NP

## 2024-08-31 ENCOUNTER — Encounter: Payer: Self-pay | Admitting: Dietician

## 2024-08-31 ENCOUNTER — Encounter: Attending: Family Medicine | Admitting: Dietician

## 2024-08-31 DIAGNOSIS — E119 Type 2 diabetes mellitus without complications: Secondary | ICD-10-CM | POA: Diagnosis present

## 2024-08-31 NOTE — Progress Notes (Signed)
 Diabetes Self-Management Education  Visit Type: First/Initial  Appt. Start Time: 0925 Appt. End Time: 1020  08/31/2024  [name], identified by name and date of birth, is a 53 y.o. male with a diagnosis of Diabetes: Type 2.   ASSESSMENT Pt reports taking Metformin  @500mg   in the morning since diagnosis, no GI side effects, states they ran out as of 3 days ago and thinks they can not get a refill until 2026. 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 Pt reports lifting weights 5-10 minutes every morning before taking a shower now. Pt reports fear of eating after diagnosis and looking for dietary guidance online. Pt states they have stopped eating bread, drinking coffee and juices, only drinking water now.   Diabetes Self-Management Education - 08/31/24 0936       Visit Information   Visit Type First/Initial      Initial Visit   Diabetes Type Type 2    Date Diagnosed 06/18/2024    Are you currently following a meal plan? No    Are you taking your medications as prescribed? Yes      Health Coping   How would you rate your overall health? Good      Psychosocial Assessment   Patient Belief/Attitude about Diabetes Motivated to manage diabetes    What is the hardest part about your diabetes right now, causing you the most concern, or is the most worrisome to you about your diabetes?   Making healty food and beverage choices;Being active    Self-care barriers None    Self-management support Doctor's office    Other persons present Patient    Special Needs None    Preferred Learning Style No preference indicated    Learning Readiness Contemplating    How often do you need to have someone help you when you read instructions,  pamphlets, or other written materials from your doctor or pharmacy? 1 - Never    What is the last grade level you completed in school? 12th grade      Pre-Education Assessment   Patient understands the diabetes disease and treatment process. Needs Instruction    Patient understands incorporating nutritional management into lifestyle. Needs Instruction    Patient undertands incorporating physical activity into lifestyle. Needs Instruction    Patient understands using medications safely. Needs Instruction    Patient understands monitoring blood glucose, interpreting and using results Needs Instruction    Patient understands prevention, detection, and treatment of acute complications. Needs Instruction    Patient understands prevention, detection, and treatment of chronic complications. Needs Instruction    Patient understands how to develop strategies to address psychosocial issues. Needs Instruction    Patient understands how to develop strategies to promote health/change behavior. Needs Instruction      Complications   Last HgB A1C per patient/outside source 9.2 %   06/10/2024   How often do you check your blood sugar? 3-4 times / week    Fasting Blood glucose range (mg/dL) --   Unable to recall   Have you had a dilated eye exam in the past 12 months? Yes    Are you checking your feet? No      Dietary Intake   Breakfast Beans (~3  cups)    Lunch Akume w/ fish (~3 cups)    Beverage(s) Water      Activity / Exercise   Activity / Exercise Type ADL's    How many days per week do you exercise? 7    How many minutes per day do you exercise? 10    Total minutes per week of exercise 70      Patient Education   Previous Diabetes Education No    Disease Pathophysiology Explored patient's options for treatment of their diabetes;Factors that contribute to the development of diabetes    Healthy Eating Plate Method    Being Active Role of exercise on diabetes management, blood pressure control  and cardiac health.;Helped patient identify appropriate exercises in relation to his/her diabetes, diabetes complications and other health issue.    Monitoring Purpose and frequency of SMBG.;Identified appropriate SMBG and/or A1C goals.    Chronic complications Relationship between chronic complications and blood glucose control    Diabetes Stress and Support Role of stress on diabetes;Identified and addressed patients feelings and concerns about diabetes;Worked with patient to identify barriers to care and solutions      Individualized Goals (developed by patient)   Nutrition Follow meal plan discussed    Physical Activity 15 minutes per day    Medications take my medication as prescribed   Refill prescription   Monitoring  Test my blood glucose as discussed    Problem Solving Medication consistency    Reducing Risk examine blood glucose patterns    Health Coping Ask for help with psychological, social, or emotional issues      Post-Education Assessment   Patient understands the diabetes disease and treatment process. Needs Review    Patient understands incorporating nutritional management into lifestyle. Needs Review    Patient undertands incorporating physical activity into lifestyle. Needs Review    Patient understands using medications safely. Needs Review    Patient understands monitoring blood glucose, interpreting and using results Needs Review    Patient understands prevention, detection, and treatment of acute complications. Needs Review    Patient understands prevention, detection, and treatment of chronic complications. Needs Review    Patient understands how to develop strategies to address psychosocial issues. Needs Review    Patient understands how to develop strategies to promote health/change behavior. Needs Review      Outcomes   Expected Outcomes Demonstrated interest in learning but significant barriers to change    Future DMSE PRN    Program Status Not Completed           Individualized Plan for Diabetes Self-Management Training:   Learning Objective:  Patient will have a greater understanding of diabetes self-management. Patient education plan is to attend individual and/or group sessions per assessed needs and concerns.   Plan:   Patient Instructions  Contact your PCP to request refill for metformin  and restart taking it immediately.  Check your blood sugar each morning before eating or drinking (fasting). Look for numbers under 130 mg/dL  Your goal J8r is below 7.0%  Begin to build your meals using the proportions of the Balanced Plate. First, select your carb choice(s) for the meal (Akume, Fufu, Rice, Bread, Fruit). Make this 25% of your meal. Next, select your source of protein to pair with your carb choice(s). Make this another 25% of your meal. Finally, complete your meal with a variety of non-starchy vegetables. Make this the remaining 50% of your meal.    Expected Outcomes:  Demonstrated interest in learning but  significant barriers to change  Education material provided: My Plate  If problems or questions, patient to contact team via:  Phone and Email  Future DSME appointment: PRN

## 2024-08-31 NOTE — Patient Instructions (Addendum)
 Contact your PCP to request refill for metformin  and restart taking it immediately.  Check your blood sugar each morning before eating or drinking (fasting). Look for numbers under 130 mg/dL  Your goal J8r is below 7.0%  Begin to build your meals using the proportions of the Balanced Plate. First, select your carb choice(s) for the meal (Akume, Fufu, Rice, Bread, Fruit). Make this 25% of your meal. Next, select your source of protein to pair with your carb choice(s). Make this another 25% of your meal. Finally, complete your meal with a variety of non-starchy vegetables. Make this the remaining 50% of your meal.

## 2024-09-10 ENCOUNTER — Other Ambulatory Visit: Payer: Self-pay | Admitting: Family Medicine

## 2024-09-10 DIAGNOSIS — N529 Male erectile dysfunction, unspecified: Secondary | ICD-10-CM

## 2024-11-20 ENCOUNTER — Other Ambulatory Visit (HOSPITAL_BASED_OUTPATIENT_CLINIC_OR_DEPARTMENT_OTHER): Payer: Self-pay

## 2024-11-23 ENCOUNTER — Ambulatory Visit: Admitting: Family Medicine

## 2024-11-23 ENCOUNTER — Encounter: Payer: Self-pay | Admitting: Family Medicine

## 2024-11-23 ENCOUNTER — Telehealth: Payer: Self-pay | Admitting: Family Medicine

## 2024-11-23 VITALS — BP 142/90 | HR 89 | Temp 98.6°F | Resp 18 | Ht 73.0 in | Wt 297.6 lb

## 2024-11-23 DIAGNOSIS — Z7984 Long term (current) use of oral hypoglycemic drugs: Secondary | ICD-10-CM | POA: Diagnosis not present

## 2024-11-23 DIAGNOSIS — N529 Male erectile dysfunction, unspecified: Secondary | ICD-10-CM | POA: Diagnosis not present

## 2024-11-23 DIAGNOSIS — E1165 Type 2 diabetes mellitus with hyperglycemia: Secondary | ICD-10-CM | POA: Diagnosis not present

## 2024-11-23 DIAGNOSIS — I10 Essential (primary) hypertension: Secondary | ICD-10-CM | POA: Diagnosis not present

## 2024-11-23 MED ORDER — SILDENAFIL CITRATE 50 MG PO TABS: 50.0000 mg | ORAL_TABLET | ORAL | 1 refills | Status: AC | PRN

## 2024-11-23 MED ORDER — VALSARTAN 80 MG PO TABS: 80.0000 mg | ORAL_TABLET | Freq: Every day | ORAL | 3 refills | Status: AC

## 2024-11-23 NOTE — Telephone Encounter (Signed)
 Copied from CRM [id number]. Topic: Clinical - Medication Question >> Nov 23, 2024 10:26 AM Olam RAMAN wrote: Reason for CRM: pt askig to speak to provider about medication refil. pt declined assistance from [employer] and wanted providers help.  cb [phone]

## 2024-11-23 NOTE — Assessment & Plan Note (Signed)
 Bp, chol and dm control is important Con't dietician F/u pcp 3 months  Refer to urology

## 2024-11-23 NOTE — Assessment & Plan Note (Signed)
Con't metformin Check labs  

## 2024-11-23 NOTE — Progress Notes (Signed)
 56*   Subjective:    Patient ID: Victor [name], male    DOB: [birth year], 54 y.o.   MRN: [id number]  Chief Complaint  Patient presents with   Medication Refill    HPI Patient is in today for refill on viagra .  Discussed the use of AI scribe software for clinical note transcription with the patient, who gave verbal consent to proceed.  History of Present Illness [name] is a 54 year old male with erectile dysfunction who presents for a follow-up visit.  He reports ongoing erectile dysfunction despite previous treatment. He was prescribed Viagra , which initially provided some relief, but he continues to face difficulties. He is concerned about the limited quantity of medication covered by his insurance, receiving only six pills despite a prescription for ten. He is considering purchasing additional pills out-of-pocket.  He has a history of hypertension and diabetes, both of which are managed with medication. He takes 40 mg of valsartan  for hypertension and adheres to his diabetes medication regimen but finds it challenging to regularly check his blood sugar levels due to the discomfort of finger pricking.  He has been advised by a dietitian to exercise and reduce meat consumption as part of his diabetes management.    Past Medical History:  Diagnosis Date   Back pain     No past surgical history on file.  No family history on file.  Social History   Socioeconomic History   Marital status: Married    Spouse name: Not on file   Number of children: Not on file   Years of education: Not on file   Highest education level: Not on file  Occupational History   Occupation: taxi driver  Tobacco Use   Smoking status: Never   Smokeless tobacco: Never  Substance and Sexual Activity   Alcohol use: Yes    Comment: occasionally   Drug use: No   Sexual activity: Not on file  Other Topics Concern   Not on file  Social History Narrative   Not on file   Social  Drivers of Health   Tobacco Use: Low Risk (11/23/2024)   Patient History    Smoking Tobacco Use: Never    Smokeless Tobacco Use: Never    Passive Exposure: Not on file  Financial Resource Strain: Not on file  Food Insecurity: Not on file  Transportation Needs: Not on file  Physical Activity: Not on file  Stress: Not on file  Social Connections: Not on file  Intimate Partner Violence: Not on file  Depression (PHQ2-9): Low Risk (08/31/2024)   Depression (PHQ2-9)    PHQ-2 Score: 0  Alcohol Screen: Not on file  Housing: Not on file  Utilities: Not on file  Health Literacy: Not on file    Outpatient Medications Prior to Visit  Medication Sig Dispense Refill   Accu-Chek Softclix Lancets lancets 1 each 3 (three) times daily.     amLODipine  (NORVASC ) 5 MG tablet Take 1 tablet (5 mg total) by mouth daily. 90 tablet 3   Blood Glucose Monitoring Suppl DEVI 1 each by Does not apply route in the morning, at noon, and at bedtime. May substitute to any manufacturer covered by patient's insurance. 1 each 0   metFORMIN  (GLUCOPHAGE ) 500 MG tablet Take 1 tablet (500 mg total) by mouth daily with breakfast. 30 tablet 3   sildenafil  (VIAGRA ) 50 MG tablet TAKE 1 TABLET(50 MG) BY MOUTH DAILY AS NEEDED FOR ERECTILE DYSFUNCTION 10 tablet 1   valsartan  (DIOVAN )  40 MG tablet Take 1 tablet (40 mg total) by mouth daily. 90 tablet 3   No facility-administered medications prior to visit.    No Known Allergies  Review of Systems  Constitutional:  Negative for fever and malaise/fatigue.  HENT:  Negative for congestion.   Eyes:  Negative for blurred vision.  Respiratory:  Negative for cough and shortness of breath.   Cardiovascular:  Negative for chest pain, palpitations and leg swelling.  Gastrointestinal:  Negative for vomiting.  Musculoskeletal:  Negative for back pain.  Skin:  Negative for rash.  Neurological:  Negative for loss of consciousness and headaches.       Objective:    Physical  Exam Vitals and nursing note reviewed.  Constitutional:      General: He is not in acute distress.    Appearance: Normal appearance. He is well-developed.  HENT:     Head: Normocephalic and atraumatic.  Eyes:     General: No scleral icterus.       Right eye: No discharge.        Left eye: No discharge.  Cardiovascular:     Rate and Rhythm: Normal rate and regular rhythm.     Heart sounds: No murmur heard. Pulmonary:     Effort: Pulmonary effort is normal. No respiratory distress.     Breath sounds: Normal breath sounds.  Musculoskeletal:        General: Normal range of motion.     Cervical back: Normal range of motion and neck supple.     Right lower leg: No edema.     Left lower leg: No edema.  Skin:    General: Skin is warm and dry.  Neurological:     Mental Status: He is alert and oriented to person, place, and time.  Psychiatric:        Mood and Affect: Mood normal.        Behavior: Behavior normal.        Thought Content: Thought content normal.        Judgment: Judgment normal.     BP (!) 142/90 (BP Location: Left Arm, Patient Position: Sitting, Cuff Size: Large)   Pulse 89   Temp 98.6 F (37 C) (Oral)   Resp 18   Ht 6' 1 (1.854 m)   Wt 297 lb 9.6 oz (135 kg)   SpO2 97%   BMI 39.26 kg/m  Wt Readings from Last 3 Encounters:  11/23/24 297 lb 9.6 oz (135 kg)  06/18/24 299 lb 12.8 oz (136 kg)  06/10/24 299 lb (135.6 kg)    Diabetic Foot Exam - Simple   No data filed    Lab Results  Component Value Date   WBC 6.3 06/10/2024   HGB 16.1 06/10/2024   HCT 48.3 06/10/2024   PLT 210.0 06/10/2024   GLUCOSE 198 (H) 06/10/2024   CHOL 191 06/10/2024   TRIG 124.0 06/10/2024   HDL 55.50 06/10/2024   LDLCALC 110 (H) 06/10/2024   ALT 29 06/10/2024   AST 18 06/10/2024   NA 139 06/10/2024   K 4.4 06/10/2024   CL 101 06/10/2024   CREATININE 0.99 06/10/2024   BUN 9 06/10/2024   CO2 30 06/10/2024   TSH 1.92 06/10/2024   HGBA1C 9.2 (H) 06/10/2024    Lab  Results  Component Value Date   TSH 1.92 06/10/2024   Lab Results  Component Value Date   WBC 6.3 06/10/2024   HGB 16.1 06/10/2024   HCT 48.3 06/10/2024  MCV 77.2 (L) 06/10/2024   PLT 210.0 06/10/2024   Lab Results  Component Value Date   NA 139 06/10/2024   K 4.4 06/10/2024   CO2 30 06/10/2024   GLUCOSE 198 (H) 06/10/2024   BUN 9 06/10/2024   CREATININE 0.99 06/10/2024   BILITOT 1.1 06/10/2024   ALKPHOS 56 06/10/2024   AST 18 06/10/2024   ALT 29 06/10/2024   PROT 7.3 06/10/2024   ALBUMIN 4.4 06/10/2024   CALCIUM 9.8 06/10/2024   GFR 86.78 06/10/2024   Lab Results  Component Value Date   CHOL 191 06/10/2024   Lab Results  Component Value Date   HDL 55.50 06/10/2024   Lab Results  Component Value Date   LDLCALC 110 (H) 06/10/2024   Lab Results  Component Value Date   TRIG 124.0 06/10/2024   Lab Results  Component Value Date   CHOLHDL 3 06/10/2024   Lab Results  Component Value Date   HGBA1C 9.2 (H) 06/10/2024       Assessment & Plan:  Erectile dysfunction, unspecified erectile dysfunction type Assessment & Plan: Bp, chol and dm control is important Con't dietician F/u pcp 3 months  Refer to urology  Orders: -     Sildenafil  Citrate; Take 1 tablet (50 mg total) by mouth as needed for erectile dysfunction.  Dispense: 10 tablet; Refill: 1 -     Ambulatory referral to Urology  Uncontrolled hypertension Assessment & Plan: Poorly controlled will alter medications, encouraged DASH diet, minimize caffeine and obtain adequate sleep. Report concerning symptoms and follow up as directed and as needed  Inc diovan  to 80 mg daily ---  f/u pcp 2-3 weeks   Orders: -     Valsartan ; Take 1 tablet (80 mg total) by mouth daily.  Dispense: 90 tablet; Refill: 3 -     Lipid panel -     CBC with Differential/Platelet -     Comprehensive metabolic panel with GFR -     Hemoglobin A1c  Type 2 diabetes mellitus with hyperglycemia, without long-term current use of  insulin (HCC) Assessment & Plan: Con't metformin  Check labs   Orders: -     Lipid panel -     CBC with Differential/Platelet -     Comprehensive metabolic panel with GFR -     Hemoglobin A1c  Assessment and Plan Assessment & Plan Erectile dysfunction   Chronic erectile dysfunction is likely due to uncontrolled hypertension and diabetes. Viagra  is effective but limited by insurance to six pills per prescription. He is referred to a urologist for further evaluation and management. Discussed the potential for daily Cialis as an alternative. Advised to inquire with a pharmacist about paying out of pocket for additional Viagra  pills.  Uncontrolled hypertension   Hypertension remains uncontrolled despite current medication. Blood pressure is elevated today. Current medication is valsartan  40 mg. Increase valsartan  to 80 mg daily after the current supply is finished. Instructed to take two 40 mg tablets daily until the current supply is exhausted, then switch to one 80 mg tablet daily.  Type 2 diabetes mellitus with hyperglycemia   Diabetes management continues with current medications. Blood sugar monitoring at home is challenging due to printing issues. Referral to a dietitian has been made for dietary management. Continue current diabetes medications. Encouraged regular blood sugar monitoring at home. Continue follow-up with a dietitian for dietary management.    Merit Maybee R Lowne Chase, DO

## 2024-11-23 NOTE — Telephone Encounter (Signed)
 Spoke with patient, he needs refills of medication for a virus. Let him know if having new viral symptoms he needs a visit. Appt scheduled.

## 2024-11-23 NOTE — Assessment & Plan Note (Signed)
 Poorly controlled will alter medications, encouraged DASH diet, minimize caffeine and obtain adequate sleep. Report concerning symptoms and follow up as directed and as needed  Inc diovan  to 80 mg daily ---  f/u pcp 2-3 weeks

## 2024-11-24 LAB — LIPID PANEL
Cholesterol: 197 mg/dL (ref 28–200)
HDL: 53.1 mg/dL
LDL Cholesterol: 109 mg/dL — ABNORMAL HIGH (ref 10–99)
NonHDL: 143.49
Total CHOL/HDL Ratio: 4
Triglycerides: 170 mg/dL — ABNORMAL HIGH (ref 10.0–149.0)
VLDL: 34 mg/dL (ref 0.0–40.0)

## 2024-11-24 LAB — COMPREHENSIVE METABOLIC PANEL WITH GFR
ALT: 27 U/L (ref 3–53)
AST: 20 U/L (ref 5–37)
Albumin: 4.4 g/dL (ref 3.5–5.2)
Alkaline Phosphatase: 68 U/L (ref 39–117)
BUN: 10 mg/dL (ref 6–23)
CO2: 30 meq/L (ref 19–32)
Calcium: 9.6 mg/dL (ref 8.4–10.5)
Chloride: 103 meq/L (ref 96–112)
Creatinine, Ser: 1.09 mg/dL (ref 0.40–1.50)
GFR: 77.07 mL/min
Glucose, Bld: 104 mg/dL — ABNORMAL HIGH (ref 70–99)
Potassium: 4 meq/L (ref 3.5–5.1)
Sodium: 140 meq/L (ref 135–145)
Total Bilirubin: 0.8 mg/dL (ref 0.2–1.2)
Total Protein: 7.7 g/dL (ref 6.0–8.3)

## 2024-11-24 LAB — CBC WITH DIFFERENTIAL/PLATELET
Basophils Absolute: 0.1 K/uL (ref 0.0–0.1)
Basophils Relative: 1.2 % (ref 0.0–3.0)
Eosinophils Absolute: 0.1 K/uL (ref 0.0–0.7)
Eosinophils Relative: 1.6 % (ref 0.0–5.0)
HCT: 46.7 % (ref 39.0–52.0)
Hemoglobin: 15.8 g/dL (ref 13.0–17.0)
Lymphocytes Relative: 41.8 % (ref 12.0–46.0)
Lymphs Abs: 3.1 K/uL (ref 0.7–4.0)
MCHC: 33.9 g/dL (ref 30.0–36.0)
MCV: 77.9 fl — ABNORMAL LOW (ref 78.0–100.0)
Monocytes Absolute: 0.9 K/uL (ref 0.1–1.0)
Monocytes Relative: 11.9 % (ref 3.0–12.0)
Neutro Abs: 3.3 K/uL (ref 1.4–7.7)
Neutrophils Relative %: 43.5 % (ref 43.0–77.0)
Platelets: 178 K/uL (ref 150.0–400.0)
RBC: 6 Mil/uL — ABNORMAL HIGH (ref 4.22–5.81)
RDW: 15.6 % — ABNORMAL HIGH (ref 11.5–15.5)
WBC: 7.5 K/uL (ref 4.0–10.5)

## 2024-11-24 LAB — HEMOGLOBIN A1C: Hgb A1c MFr Bld: 7.6 % — ABNORMAL HIGH (ref 4.6–6.5)

## 2024-11-29 ENCOUNTER — Ambulatory Visit: Payer: Self-pay | Admitting: Family Medicine

## 2024-12-01 ENCOUNTER — Other Ambulatory Visit: Payer: Self-pay

## 2024-12-01 MED ORDER — METFORMIN HCL 500 MG PO TABS: 500.0000 mg | ORAL_TABLET | Freq: Two times a day (BID) | ORAL | 2 refills | Status: AC

## 2024-12-01 MED ORDER — ROSUVASTATIN CALCIUM 10 MG PO TABS: 10.0000 mg | ORAL_TABLET | Freq: Every day | ORAL | 2 refills | Status: AC
